# Patient Record
Sex: Female | Born: 1960
Health system: Southern US, Community
[De-identification: ages and names within clinical notes are randomized; demographics above are authoritative.]

## PROBLEM LIST (undated history)

## (undated) DIAGNOSIS — M79645 Pain in left finger(s): Secondary | ICD-10-CM

## (undated) DIAGNOSIS — E785 Hyperlipidemia, unspecified: Secondary | ICD-10-CM

## (undated) DIAGNOSIS — R2230 Localized swelling, mass and lump, unspecified upper limb: Secondary | ICD-10-CM

## (undated) DIAGNOSIS — H409 Unspecified glaucoma: Secondary | ICD-10-CM

## (undated) DIAGNOSIS — M1990 Unspecified osteoarthritis, unspecified site: Secondary | ICD-10-CM

## (undated) DIAGNOSIS — R030 Elevated blood-pressure reading, without diagnosis of hypertension: Secondary | ICD-10-CM

## (undated) DIAGNOSIS — R112 Nausea with vomiting, unspecified: Secondary | ICD-10-CM

## (undated) DIAGNOSIS — T7840XA Allergy, unspecified, initial encounter: Secondary | ICD-10-CM

## (undated) DIAGNOSIS — K449 Diaphragmatic hernia without obstruction or gangrene: Secondary | ICD-10-CM

## (undated) DIAGNOSIS — Z9889 Other specified postprocedural states: Secondary | ICD-10-CM

## (undated) DIAGNOSIS — Z973 Presence of spectacles and contact lenses: Secondary | ICD-10-CM

## (undated) DIAGNOSIS — F909 Attention-deficit hyperactivity disorder, unspecified type: Secondary | ICD-10-CM

## (undated) DIAGNOSIS — K219 Gastro-esophageal reflux disease without esophagitis: Secondary | ICD-10-CM

## (undated) DIAGNOSIS — L309 Dermatitis, unspecified: Secondary | ICD-10-CM

## (undated) DIAGNOSIS — J45909 Unspecified asthma, uncomplicated: Secondary | ICD-10-CM

## (undated) HISTORY — DX: Diaphragmatic hernia without obstruction or gangrene: K44.9

## (undated) HISTORY — DX: Hyperlipidemia, unspecified: E78.5

## (undated) HISTORY — PX: TONSILLECTOMY AND ADENOIDECTOMY: SUR1326

## (undated) HISTORY — PX: COLONOSCOPY: SHX174

## (undated) HISTORY — DX: Allergy, unspecified, initial encounter: T78.40XA

## (undated) HISTORY — DX: Unspecified asthma, uncomplicated: J45.909

## (undated) HISTORY — DX: Unspecified osteoarthritis, unspecified site: M19.90

## (undated) HISTORY — DX: Dermatitis, unspecified: L30.9

## (undated) HISTORY — DX: Gastro-esophageal reflux disease without esophagitis: K21.9

## (undated) HISTORY — PX: OTHER SURGICAL HISTORY: SHX169

## (undated) HISTORY — DX: Unspecified glaucoma: H40.9

---

## 1998-02-19 ENCOUNTER — Other Ambulatory Visit: Admission: RE | Admit: 1998-02-19 | Discharge: 1998-02-19 | Payer: Self-pay | Admitting: Obstetrics and Gynecology

## 1999-04-07 ENCOUNTER — Other Ambulatory Visit: Admission: RE | Admit: 1999-04-07 | Discharge: 1999-04-07 | Payer: Self-pay | Admitting: Obstetrics and Gynecology

## 2000-04-12 ENCOUNTER — Other Ambulatory Visit: Admission: RE | Admit: 2000-04-12 | Discharge: 2000-04-12 | Payer: Self-pay | Admitting: Obstetrics and Gynecology

## 2001-04-25 ENCOUNTER — Other Ambulatory Visit: Admission: RE | Admit: 2001-04-25 | Discharge: 2001-04-25 | Payer: Self-pay | Admitting: Obstetrics and Gynecology

## 2001-06-09 ENCOUNTER — Encounter: Payer: Self-pay | Admitting: Internal Medicine

## 2001-06-09 ENCOUNTER — Encounter: Admission: RE | Admit: 2001-06-09 | Discharge: 2001-06-09 | Payer: Self-pay | Admitting: Internal Medicine

## 2002-05-18 ENCOUNTER — Other Ambulatory Visit: Admission: RE | Admit: 2002-05-18 | Discharge: 2002-05-18 | Payer: Self-pay | Admitting: Obstetrics and Gynecology

## 2003-06-08 ENCOUNTER — Other Ambulatory Visit: Admission: RE | Admit: 2003-06-08 | Discharge: 2003-06-08 | Payer: Self-pay | Admitting: Obstetrics and Gynecology

## 2004-06-16 ENCOUNTER — Other Ambulatory Visit: Admission: RE | Admit: 2004-06-16 | Discharge: 2004-06-16 | Payer: Self-pay | Admitting: Obstetrics and Gynecology

## 2005-02-16 HISTORY — PX: SHOULDER ARTHROSCOPY: SHX128

## 2005-02-26 ENCOUNTER — Encounter: Admission: RE | Admit: 2005-02-26 | Discharge: 2005-02-26 | Payer: Self-pay | Admitting: Specialist

## 2006-01-15 ENCOUNTER — Ambulatory Visit (HOSPITAL_COMMUNITY): Admission: RE | Admit: 2006-01-15 | Discharge: 2006-01-15 | Payer: Self-pay | Admitting: Internal Medicine

## 2006-01-21 ENCOUNTER — Encounter: Admission: RE | Admit: 2006-01-21 | Discharge: 2006-01-21 | Payer: Self-pay | Admitting: Internal Medicine

## 2006-03-15 ENCOUNTER — Ambulatory Visit: Payer: Self-pay | Admitting: Internal Medicine

## 2006-06-04 ENCOUNTER — Encounter
Admission: RE | Admit: 2006-06-04 | Discharge: 2006-09-02 | Payer: Self-pay | Admitting: Physical Medicine & Rehabilitation

## 2006-06-08 ENCOUNTER — Ambulatory Visit: Payer: Self-pay | Admitting: Physical Medicine & Rehabilitation

## 2008-02-17 HISTORY — PX: KNEE ARTHROSCOPY: SUR90

## 2009-03-25 ENCOUNTER — Telehealth: Payer: Self-pay | Admitting: Internal Medicine

## 2009-03-26 ENCOUNTER — Encounter: Payer: Self-pay | Admitting: Internal Medicine

## 2009-03-26 ENCOUNTER — Ambulatory Visit: Payer: Self-pay | Admitting: Gastroenterology

## 2009-03-26 DIAGNOSIS — R12 Heartburn: Secondary | ICD-10-CM | POA: Insufficient documentation

## 2009-03-26 DIAGNOSIS — K219 Gastro-esophageal reflux disease without esophagitis: Secondary | ICD-10-CM | POA: Insufficient documentation

## 2009-03-26 DIAGNOSIS — K3189 Other diseases of stomach and duodenum: Secondary | ICD-10-CM | POA: Insufficient documentation

## 2009-03-26 DIAGNOSIS — M1990 Unspecified osteoarthritis, unspecified site: Secondary | ICD-10-CM | POA: Insufficient documentation

## 2009-03-26 DIAGNOSIS — R1013 Epigastric pain: Secondary | ICD-10-CM | POA: Insufficient documentation

## 2009-05-02 ENCOUNTER — Ambulatory Visit: Payer: Self-pay | Admitting: Internal Medicine

## 2009-05-04 ENCOUNTER — Encounter: Payer: Self-pay | Admitting: Internal Medicine

## 2010-03-18 NOTE — Assessment & Plan Note (Signed)
Summary: WORSENING GERD,DYSPHAGIA       (DR.BRODIE PT.)          Anita Snow   History of Present Illness Visit Type: Initial Visit Primary GI MD: Lina Sar MD Primary Provider: Monna Fam Chief Complaint: Worsening Anita Snow, pt states she can not take OTC PPI's states  they give her bad headaches History of Present Illness:   50 YO FEMALE KNOWN TO DR. Juanda Chance WHO WAS LAST SEEN IN 2008. SHEHAS HX OF DYSPEPSIA,PREVIOUS REMOTE RX FOR H.PYLORI. SHE HAD UNDERGONE WOKUP OF GB AS WELL WHICH WAS NEGATIVE. SHE COMES IN TODAY WITH RECURRENT DYSPEPTIC SXS. SHE HAD BEEN USING MOBIC AND MOTRIN REGULARLY FOR ORTHOPEDIC ISSUES  A FEW MONTHS BACK AND DEVELOPED SXS.SHE WAS GIVEN A ONE MONTH RX FOR NEXIUM AND SHE FELT BETTER, HAS BEEN TRYING TO AVOID NSAIDS SINCE. SHE CONTINUES TO HAVE INTERMITTENT EPIGASTRIC PRESSURE,SOME HEARTBURN,NO DYSPHAGIA.SHE WAKES UP WITH A BAD TASTE IN HER MOUTH.  SHE HAS NO CURRENT LOWER GI SXS,HAS NOT HAD A SCREENING COLONOSCOPY.   GI Review of Systems    Reports abdominal pain and  heartburn.     Location of  Abdominal pain: epigastric area.    Denies acid reflux, belching, bloating, chest pain, dysphagia with liquids, dysphagia with solids, loss of appetite, nausea, vomiting, vomiting blood, and  weight loss.        Denies anal fissure, black tarry stools, change in bowel habit, constipation, diarrhea, diverticulosis, fecal incontinence, heme positive stool, hemorrhoids, irritable bowel syndrome, jaundice, light color stool, liver problems, rectal bleeding, and  rectal pain. Preventive Screening-Counseling & Management  Alcohol-Tobacco     Smoking Status: never      Drug Use:  no.      Current Medications (verified): 1)  Mobic 7.5 Mg Tabs (Meloxicam) .... As Needed 2)  Vyvanse 60 Mg Caps (Lisdexamfetamine Dimesylate) .Marland Kitchen.. 1 By Mouth Once Daily 3)  Ortho-Novum 7/7/7 (28) 0.5/0.75/1-35 Mg-Mcg Tabs (Norethin-Eth Estrad Triphasic) .Marland Kitchen.. 1 By Mouth Once Daily  Allergies  (verified): 1)  ! Cephalexin 2)  ! * Shellfish  Past History:  Past Medical History: Arthritis GERD hiatal henia 1995  Past Surgical History: c-sectionx2 shoulder surgery  Family History: Family History of Heart Disease:  No FH of Colon Cancer:  Social History: married 2 children Patient has never smoked.  Daily Caffeine Use Alcohol Use - yes  2 per week Illicit Drug Use - no Smoking Status:  never Drug Use:  no  Review of Systems       The patient complains of allergy/sinus and arthritis/joint pain.  The patient denies anemia, anxiety-new, blood in urine, breast changes/lumps, change in vision, confusion, cough, coughing up blood, depression-new, fainting, fatigue, fever, headaches-new, hearing problems, heart murmur, heart rhythm changes, muscle pains/cramps, night sweats, nosebleeds, pregnancy symptoms, shortness of breath, skin rash, sleeping problems, sore throat, swelling of feet/legs, swollen lymph glands, thirst - excessive, urination - excessive, urination changes/pain, urine leakage, vision changes, and voice change.         ROS OTHERWISE AS IN HPI  Vital Signs:  Patient profile:   50 year old female Height:      65 inches Weight:      148 pounds BMI:     24.72 BSA:     1.74 Pulse rate:   80 / minute Pulse rhythm:   regular BP sitting:   132 / 78  (left arm)  Vitals Entered By: [name] CMA [name]) (March 26, 2009 2:13 PM)  Physical Exam  General:  Well developed, well nourished, no acute distress. Head:  Normocephalic and atraumatic. Eyes:  PERRLA, no icterus. Lungs:  Clear throughout to auscultation. Heart:  Regular rate and rhythm; no murmurs, rubs,  or bruits. Abdomen:  SOFT, MINIMALLY TENDER EPIGASTRIUM, NO MASS OR HSM,BS+ Rectal:  NOT DONE Extremities:  No clubbing, cyanosis, edema or deformities noted. Neurologic:  Alert and  oriented x4;  grossly normal neurologically. Psych:  Alert and cooperative. Normal mood and  affect.   Impression & Recommendations:  Problem # 1:  DYSPEPSIA (ICD-536.8) Assessment Deteriorated RECURRENT SXS,PROBABLY NSAID INDUCED GASTROPATHY,COMPONENT OF GERD.  RESTERT NEXIUM 40 MG DAILY IN AM SCHEDULE FOR UPPER ENDOSCOPY WITH DR. Hermelinda Medicus DISCUSSED IN DETAIL WITH PT. AVOID NSAIDS Orders: Colon/Endo (Colon/Endo)  Problem # 2:  COLON NEOPLASIA SCREENING Assessment: Comment Only CURRENTLY ASYMPTOMATIC  SCHEDULE FOR COLONOSCOPY WITH DR. Juanda Chance.PROCEDURE DISCUSSED IN DETAIL WITH PT.  Patient Instructions: 1)  Endoscopy and Colonoscopy scheduled for 05-02-09 with Dr. Juanda Chance . 2)  Instructions provided. 3)  Perscription for Miralax Colon preperation and Nexium, 30 day supply sent to [facility] Dr. 4)  90 day supply of Nexium sent to you mail order pharmacy. 5)  Copy sent to : [name], MD 6)  The medication list was reviewed and reconciled.  All changed / newly prescribed medications were explained.  A complete medication list was provided to the patient / caregiver. Prescriptions: NEXIUM 40 MG CPDR (ESOMEPRAZOLE MAGNESIUM) Take 1 capsule 30 min prior to breakfast  #90 x 3   Entered by:   Lowry Ram NCMA   Authorized by:   Sammuel Cooper PA-c   Signed by:   Lowry Ram NCMA on 03/26/2009   Method used:   Electronically to        [facility]* (mail-order)             ,          Ph: [phone]       Fax: [phone]   RxID:   [id number] NEXIUM 40 MG CPDR (ESOMEPRAZOLE MAGNESIUM) Take 1 capsule 30 min prior to breakfast  #30 x 0   Entered by:   Lowry Ram NCMA   Authorized by:   Sammuel Cooper PA-c   Signed by:   Lowry Ram NCMA on 03/26/2009   Method used:   Electronically to        [facility] # [phone]* (retail)       [address]       [place], [place]  [address]       Ph: [phone]       Fax: [phone]   RxID:   [id number] NEXIUM 40 MG CPDR (ESOMEPRAZOLE MAGNESIUM) Take 1 capsule 30 min prior to  breakfast  #30 x 0   Entered by:   Lowry Ram NCMA   Authorized by:   Sammuel Cooper PA-c   Signed by:   Lowry Ram NCMA on 03/26/2009   Method used:   Electronically to        [facility] # [id number]* (retail)       [address]       [place], [place]  [address]       Ph: [phone]       Fax: [phone]   RxID:   [id number] REGLAN 10 MG  TABS (METOCLOPRAMIDE HCL) As per prep instructions.  #2 x 0   Entered by:   Lowry Ram NCMA   Authorized by:   Sammuel Cooper  PA-c   Signed by:   Lowry Ram NCMA on 03/26/2009   Method used:   Electronically to        [facility] # [phone]* (retail)       [address]       [place], [place]  [address]       Ph: [phone]       Fax: [phone]   RxID:   [id number] METOCLOPRAMIDE HCL 10 MG  TABS (METOCLOPRAMIDE HCL) As per prep instructions.  #2 x 0   Entered by:   Lowry Ram NCMA   Authorized by:   Sammuel Cooper PA-c   Signed by:   Lowry Ram NCMA on 03/26/2009   Method used:   Electronically to        [facility] # [phone]* (retail)       [address]       [place], [place]  [address]       Ph: [phone]       Fax: [phone]   RxID:   [id number] MIRALAX   POWD (POLYETHYLENE GLYCOL 3350) As per prep  instructions.  #255gm x 0   Entered by:   Lowry Ram NCMA   Authorized by:   Sammuel Cooper PA-c   Signed by:   Lowry Ram NCMA on 03/26/2009   Method used:   Electronically to        [facility] # [id number]* (retail)       [address]       [place], [place]  [address]       Ph: [phone]       Fax: [phone]   RxID:   [id number]  One Nexium perscription sent to [facility] in error. Intention was to sent on the [facility] order and one to [facility]. Dr.

## 2010-03-18 NOTE — Procedures (Signed)
Summary: Colonoscopy  Patient: [name] Note: All result statuses are Final unless otherwise noted.  Tests: (1) Colonoscopy (COL)   COL Colonoscopy           DONE     [facility]     [address]     [place], [place]  [address]           COLONOSCOPY PROCEDURE REPORT           PATIENT:  Anita Snow, Anita Snow  MR#:  [id number]     BIRTHDATE:  [birth year], 48 yrs. old  GENDER:  female           ENDOSCOPIST:  Hedwig Morton. Juanda Chance, MD     Referred by:  [name], M.D.           PROCEDURE DATE:  05/02/2009     PROCEDURE:  Colonoscopy 19[address]     ASA CLASS:  Class I     INDICATIONS:  Routine Risk Screening           MEDICATIONS:   Versed 1 mg, Fentanyl 50 mcg           DESCRIPTION OF PROCEDURE:   After the risks benefits and     alternatives of the procedure were thoroughly explained, informed     consent was obtained.  Digital rectal exam was performed and     revealed no rectal masses.   The LB CF-H180AL [id number] endoscope     was introduced through the anus and advanced to the cecum, which     was identified by both the appendix and ileocecal valve, without     limitations.  The quality of the prep was good, using MiraLax.     The instrument was then slowly withdrawn as the colon was fully     examined.     <<PROCEDUREIMAGES>>           FINDINGS:  No polyps or cancers were seen (see image1, image2,     image3, and image4).   Retroflexed views in the rectum revealed no     abnormalities.    The scope was then withdrawn from the patient     and the procedure completed.           COMPLICATIONS:  None           ENDOSCOPIC IMPRESSION:     1) No polyps or cancers     2) Normal colonoscopy     RECOMMENDATIONS:     1) high fiber diet           REPEAT EXAM:  In 10 year(s) for.           ______________________________     Hedwig Morton. Juanda Chance, MD           CC:           n.     eSIGNED:   Hedwig Morton. Seven [place] at 05/02/2009 08:50 AM           Veronia Beets, [id number]  Note: An  exclamation mark (!) indicates a result that was not dispersed into the flowsheet. Document Creation Date: 05/02/2009 8:51 AM _______________________________________________________________________  (1) Order result status: Final Collection or observation date-time: 05/02/2009 08:36 Requested date-time:  Receipt date-time:  Reported date-time:  Referring Physician:   Ordering Physician: Lina Sar [id number]) Specimen Source:  Source: Launa Grill Order Number: [id number] Lab site:   Appended Document: Colonoscopy    Clinical Lists Changes  Observations: Added new observation of  COLONNXTDUE: 04/2019 (05/02/2009 13:32)      Appended Document: Colonoscopy     Procedures Next Due Date:    Colonoscopy: 05/2019

## 2010-03-18 NOTE — Procedures (Signed)
Summary: Upper Endoscopy  Patient: Amil Moseman Note: All result statuses are Final unless otherwise noted.  Tests: (1) Upper Endoscopy (EGD)   EGD Upper Endoscopy       DONE     [facility]     [address]     [place], [place]  [address]           ENDOSCOPY PROCEDURE REPORT           PATIENT:  Skarleth, Delmonico  MR#:  [id number]     BIRTHDATE:  12-02-1960, 48 yrs. old  GENDER:  female           ENDOSCOPIST:  Hedwig Morton. Juanda Chance, MD     Referred by:  [name], M.D.           PROCEDURE DATE:  05/02/2009     PROCEDURE:  EGD with biopsy     ASA CLASS:  Class I     INDICATIONS:  GERD, heartburn improved on Nexiem, D/C'ed NSAIDs     treated for H.pylori in the past           MEDICATIONS:   Versed 9 mg, Fentanyl 50 mcg     TOPICAL ANESTHETIC:  Exactacain Spray           DESCRIPTION OF PROCEDURE:   After the risks benefits and     alternatives of the procedure were thoroughly explained, informed     consent was obtained.  The LB GIF-H180 [id number] endoscope was     introduced through the mouth and advanced to the second portion of     the duodenum, without limitations.  The instrument was slowly     withdrawn as the mucosa was fully examined.     <<PROCEDUREIMAGES>>           A hiatal hernia was found. 1-2 cm sliding hiatal hernia With     standard forceps, a biopsy was obtained and sent to pathology (see     image1 and image4).  Otherwise the examination was normal.     biopsies from a normal appearing antrum With standard forceps, a     biopsy was obtained and sent to pathology. hx of H (see image3 and     image2).Pylori    Retroflexed views revealed no abnormalities.     The scope was then withdrawn from the patient and the procedure     completed.           COMPLICATIONS:  None           ENDOSCOPIC IMPRESSION:     1) Hiatal hernia     2) Otherwise normal examination     s/p biopsies from gastric antrum and from g-e junction     RECOMMENDATIONS:     1) Await  biopsy results     2) Anti-reflux regimen to be follow     cont. Nexiem 40 mg daily           REPEAT EXAM:  In 0 year(s) for.           ______________________________     Hedwig Morton. Juanda Chance, MD           CC:           n.     eSIGNED:   Hedwig Morton. Brodie at 05/02/2009 08:46 AM           Veronia Beets, [id number]  Note: An exclamation mark (!) indicates a result that was not dispersed into the  flowsheet. Document Creation Date: 05/02/2009 8:47 AM _______________________________________________________________________  (1) Order result status: Final Collection or observation date-time: 05/02/2009 08:23 Requested date-time:  Receipt date-time:  Reported date-time:  Referring Physician:   Ordering Physician: Lina Sar [phone]) Specimen Source:  Source: Launa Grill Order Number: [id number] Lab site:

## 2010-03-18 NOTE — Letter (Signed)
Summary: Patient [name] Biopsy Results  [name]  [address] [place], [place] [address]   Phone: [phone]  Fax: [phone]        May 04, 2009 MRN: [id number]    [facility] [address] [place], [place]  [address]    Dear Anita Snow,  I am pleased to inform you that the biopsies taken during your recent endoscopic examination did not show any evidence of cancer upon pathologic examination.The biopsies show mild inflammation due to reflux  Additional information/recommendations:  __No further action is needed at this time.  Please follow-up with      your primary care physician for your other healthcare needs.  __ Please call [phone] to schedule a return visit to review      your condition.  _x_ Continue with the treatment plan as outlined on the day of your      exam.  __ You should have a repeat endoscopic examination for this problem              in _ months/years.   Please call us if you are having persistent problems or have questions about your condition that have not been fully answered at this time.  Sincerely,  Hart Carwin MD  This letter has been electronically signed by your physician.  Appended Document: Patient Notice-Endo Biopsy Results Letter mailed 3.22.11

## 2010-03-18 NOTE — Progress Notes (Signed)
Summary: TRIAGE  Phone Note Call from Patient Call back at [phone]   Caller: Patient Call For: Dr. Juanda Chance Reason for Call: Talk to Nurse Summary of Call: Pt is having problems with her Acid Reflux and said she cannot wait until April to see Dr. Juanda Chance. Wants a sooner appt. Initial call taken by: Karna Christmas,  March 25, 2009 3:25 PM  Follow-up for Phone Call        Worsening GERD and dysphagia. Doesn't take a PPI. Wants an ASAP appt.  Pt. will see [facility] 03-26-09 at 2pm.  Follow-up by: Laureen Ochs LPN,  March 25, 2009 3:32 PM

## 2010-03-18 NOTE — Letter (Signed)
Summary: [facility] Instructions  [facility]  [address] [place], [place] [address]   Phone: [phone]  Fax: [phone]       Anita Snow    03-Jan-1961    MRN: [id number]       Procedure Day /Date:05-02-09     Arrival Time: 7:30 AM     Procedure Time: 8:00 AM     Location of Procedure:                    X     [facility] (4th Floor)       PREPARATION FOR COLONOSCOPY WITH MIRALAX  Starting 5 days prior to your procedure 04-27-09 do not eat nuts, seeds, popcorn, corn, beans, peas,  salads, or any raw vegetables.  Do not take any fiber supplements (e.g. Metamucil, Citrucel, and Benefiber). ____________________________________________________________________________________________________   THE DAY BEFORE YOUR PROCEDURE         DATE:05-01-09 DAY: Wednesday  1   Drink clear liquids the entire day-NO SOLID FOOD  2   Do not drink anything colored red or purple.  Avoid juices with pulp.  No orange juice.  3   Drink at least 64 oz. (8 glasses) of fluid/clear liquids during the day to prevent dehydration and help the prep work efficiently.  CLEAR LIQUIDS INCLUDE: Water Jello Ice Popsicles Tea (sugar ok, no milk/cream) Powdered fruit flavored drinks Coffee (sugar ok, no milk/cream) Gatorade Juice: apple, white grape, white cranberry  Lemonade Clear bullion, consomm, broth Carbonated beverages (any kind) Strained chicken noodle soup Hard Candy  4   Mix the entire bottle of Miralax with 64 oz. of Gatorade/Powerade in the morning and put in the refrigerator to chill.  5   At 3:00 pm take 2 Dulcolax/Bisacodyl tablets.  6   At 4:30 pm take one Reglan/Metoclopramide tablet.  7  Starting at 5:00 pm drink one 8 oz glass of the Miralax mixture every 15-20 minutes until you have finished drinking the entire 64 oz.  You should finish drinking prep around 7:30 or 8:00 pm.  8   If you are nauseated, you may take the 2nd Reglan/Metoclopramide tablet at  6:30 pm.        9    At 8:00 pm take 2 more DULCOLAX/Bisacodyl tablets.     THE DAY OF YOUR PROCEDURE      DATE:  05-02-09 DAY: Thursday  You may drink clear liquids until 6:00 AM (2 HOURS BEFORE PROCEDURE).   MEDICATION INSTRUCTIONS  Unless otherwise instructed, you should take regular prescription medications with a small sip of water as early as possible the morning of your procedure.       OTHER INSTRUCTIONS  You will need a responsible adult at least 52 years of age to accompany you and drive you home.   This person must remain in the waiting room during your procedure.  Wear loose fitting clothing that is easily removed.  Leave jewelry and other valuables at home.  However, you may wish to bring a book to read or an iPod/MP3 player to listen to music as you wait for your procedure to start.  Remove all body piercing jewelry and leave at home.  Total time from sign-in until discharge is approximately 2-3 hours.  You should go home directly after your procedure and rest.  You can resume normal activities the day after your procedure.  The day of your procedure you should not:   Drive   Make legal  decisions   Operate machinery   Drink alcohol   Return to work  You will receive specific instructions about eating, activities and medications before you leave.   The above instructions have been reviewed and explained to me by   _______________________    I fully understand and can verbalize these instructions _____________________________ Date _______

## 2010-07-04 NOTE — Group Therapy Note (Signed)
In consult, questioned by Dr. Sheran Luz in regards to neck pain,  assess for acupuncture therapy.   This is a 50 year old female with a 2-year history of neck pain,  insidious onset associated with left shoulder. Scapular, and forearm  pain.  She has seen orthopedics in regards to her shoulder and it was  not felt to have any significant rotator cuff pathology.  She has  responded partially to physical therapy doing scapular strengthening  exercises as well as some neck range of motion exercises.  She has tried  Flexeril but could not tolerate this from a drowsiness standpoint in the  morning, has difficulty tolerating NSAIDS.  Had an MRI of her cervical  spine showing a shallow base disc protrusion at C5-6.  No significant  spinal stenosis.  She has occasional paresthesias in her left arm but no  consistent numbness, no hand pain or numbness.   PAST MEDICAL HISTORY:  She had some reflux during pregnancy, no history  of ulcers, has some swelling in the lower extremities, sometimes feels  like it is associated with certain foods she eats.   MEDICATIONS:  Adderall XR, Ortho-Novum, and p.r.n. antihistamines --  Flonase -- on a seasonal basis.  She is on Nexium for hiatal hernia.   ALLERGIES:  CEPHALOSPORINS, SHRIMP, and CRAB -- causing a swelling lips,  fingers, hands.  Hives to the index.  Contact dermatitis to dry cleaning  or detergens.   PAST SURGICAL HISTORY:  Tonsillectomy in 1968, C-section in 1991 and  1994.   SOCIAL HISTORY:  Married, lives with husband, 2 boys, part-time work.   FAMILY HISTORY:  Heart disease, alcohol abuse.   EXAMINATION:  VITAL SIGNS:  Blood pressure 135/78, pulse 65, respiratory  rate 16, 02 saturation 100% on room air.  GENERAL:  A well-developed well-nourished female, appearing younger than  stated age.  NECK:  Has 75% range of motion, lateral bending, and flexion and  extension.  No tenderness along the axial midline of the cervical spine.  No  tenderness over the upper trapezius or scapulary region.  She has  negative impingement signs, negative Spurling's maneuver, normal  strength in deltoids, biceps, grip, wrist extension and arm, external  rotators as well as hip flexion and knee extension.  Full dorsiflexion.  Gait is normal.  Sensation normal in the extremities.  Deep tendon  reflexes are normal in the upper and lower extremities.  No tenderness  over the pectoralis or clavicular area on the left side.  Tenderness  over the left extensor forearm at midpoint.   IMPRESSION:  Cervical pain appears to be a combination of cervical facet  given pain that is exacerbated by extension which occurs usually during  sleeping as well as cervical and parascapular myofascial pain.  She does  not have a clear radicular pattern to her left upper extremity,  paresthesias and pain pattern and this also could be referred from more  possible trigger points.  She also desires acupuncture to sinus as well  as GI symptoms.  This can be incorporated to her overall treatment and  plan.   ADDENDUM:  Acupuncture treatment today consisted of bilateral KI3, SP6,  ST36 as well as left TH8, left TH5.  E stem between SP6, KI3, as well as  TH5 and TH8, 2 Hz x20 minutes.  The patient tolerated the procedure  well.  Will schedule for twice weekly for 2 more weeks treatment.  Will  incorporate sinus treatments next visit.  Erick Colace, M.D.  Electronically Signed     AEK/MedQ  D:  06/08/2006 09:36:43  T:  06/08/2006 10:41:20  Job #:  [id number]   cc:   Hayden Rasmussen, M.D.

## 2010-07-04 NOTE — Procedures (Signed)
Anita Snow, Anita Snow                ACCOUNT NO.:  [id number]   MEDICAL RECORD NO.:  [id number]          PATIENT TYPE:  REC   LOCATION:  [facility]                          FACILITY:  [facility]   PHYSICIAN:  Erick Colace, M.D.DATE OF BIRTH:  Oct 01, 1960   DATE OF PROCEDURE:  DATE OF DISCHARGE:                               OPERATIVE REPORT   PROCEDURE PERFORMED:  Acupuncture treatment #3.   Treatment today consisted of bilateral BL10, as well as paraspinal at C4  3 cm lateral to spinous process bilaterally, left GB21 and DU14.  Electrostimulation between all needles except for D14 4 Hz x30 minutes.  The patient tolerated the procedure well. Postprocedure instructions  given.      Erick Colace, M.D.  Electronically Signed     AEK/MEDQ  D:  06/21/2006 16:47:48  T:  06/22/2006 06:48:03  Job:  [id number]

## 2010-07-04 NOTE — Procedures (Signed)
NAMELLESENIA, FOGAL                ACCOUNT NO.:  [id number]   MEDICAL RECORD NO.:  [id number]          PATIENT TYPE:  REC   LOCATION:  [facility]                          FACILITY:  [facility]   PHYSICIAN:  Erick Colace, M.D.DATE OF BIRTH:  04/04/1960   DATE OF PROCEDURE:  06/28/2006  DATE OF DISCHARGE:                               OPERATIVE REPORT   This 50 year old female is scheduled for acupuncture treatment.  This  will be her fourth treatment and she is deriving benefit thus far.  Her  pain is down to a 1/10 level, it is mainly in the parascapular region on  left and neck area more left than right.   Treatment today consisted of SI-11, BL-10, BL-44, and SI-13 electrical  stimulation 4 Hz x30 minutes.  Patient tolerated the procedure well.  Will spread out treatments to once a week and see her again next week  and from there may be able to go 2 weeks between treatments and  eventually 1 month.  Patient is continuing to do yoga and I encouraged  her to continue this.      Erick Colace, M.D.  Electronically Signed     AEK/MEDQ  D:  06/28/2006 16:15:58  T:  06/28/2006 22:16:46  Job:  [id number]   cc:   Caralyn Guile. Ethelene Hal, M.D.  Fax: [phone]

## 2010-07-04 NOTE — Assessment & Plan Note (Signed)
[facility]                         GASTROENTEROLOGY OFFICE NOTE   AJIA, CHADDERDON                       MRN:          [id number]  DATE:03/15/2006                            DOB:          November 17, 1960    Ms. Hanzlik is a very nice 50 year old white female who is here at Dr.  Netta Corrigan referral for evaluation of dyspepsia.  I have seen her  indirectly in 1991, when she had an upper abdominal ultrasound in May  2001 for right upper quadrant abdominal pain.  Her ultrasound at that  time was completely normal.  She dates onset of her digestive problems  to her first and second pregnancy  in 30 and 31.  She had symptoms  of reflux and was evaluated in [place], [place] .  She had upper  endoscopy that showed small hiatal, gastritis and H. Pylori antibody.  Patient was treated for H. Pylori in 1990s and again was retreated  later, after she had recurrence of her dyspepsia.  Since then, she had  off and on food intolerance.  Her symptoms have flared up after course  of Advil, the last four taking one Advil twice a day for about a month  for neck pain.  Dr. Merla Riches started her on Nexium 40 mg a day, which  has finally had good results in reducing the dyspepsia.  She is about  80% improved now.  There is no family history of digestive problems.  She feels that she has always had a sensitive stomach even as a child.  Repeat upper abdominal ultrasound and HIDA scan in the fall of 2007 were  negative.  Her discomfort was localized to epigastrium along the right  costal margin, somewhat to the back, but currently her discomfort is  only in the epigastrium.  There has been no dysphagia.  Dr. Clearance Coots tried  her in the past on antispasmodic.  She reported good results with this  medication, but has not taken it recently.   MEDICATIONS:  1. Ortho-Novum.  2. Adderall XR 25 mg q.d.  3. Nexium 40 mg p.o. q.d.   PAST MEDICAL HISTORY:  Tonsillectomy and  adenoidectomy, 2005 two c-  sections 1991, 1994.   FAMILY HISTORY:  Heart disease in her father, alcoholism in her father.   SOCIAL HISTORY:  Married, has two children.  He works part-time in TEFL teacher.  She is a Engineer, maintenance (IT).  She does not smoke,  drinks alcohol only socially, but is seems to cause digestive problems.   REVIEW OF SYSTEMS:  POSITIVE FOR ALLERGIES, ARTHRITIC COMPLAINTS,  OCCASIONAL SORE THROAT.   PHYSICAL EXAMINATION:  VITAL SIGNS:  Blood pressure 118/76, pulse 68 and  weight 142 pounds.  GENERAL:  She was alert, oriented, in no distress.  LUNGS:  Clear to auscultation.  COR:  Normal S1, normal S2.  HEENT:  Sclerae is nonicteric.  ABDOMEN:  Soft with discomfort above the umbilicus in the midline, and  somewhat to the left and right of the midline as well, also in  subxyphoid area.  There were no pulsations.  The liver edge  was at  costal margin.  The liver itself did not appear to be tender.  There  were no scars.  ABDOMEN:  Unremarkable.  RECTAL:  Showed normal rectal tones with hemoccult-negative stool.  EXTREMITIES:  Trace edema.   IMPRESSION:  A 50 year old white female with chronic dyspepsia.  History  of H. Pylori treated twice.  Her symptoms are suggestive for functional  dyspepsia.  After having negative gallbladder status, I think it is very  unlikely that her symptoms are related to poor gallbladder function,  although it can never be ruled out 100%.  Since she has been improved on  proton pump inhibitor, I suspect that her dyspepsia is acid related.   I have offered her repeat upper endoscopy, but she would rather wait and  continue to take her Nexium.  It is okay with me.  I do not necessary  feel that she has to have another endoscopy, but if her symptoms do  recur, or if they are not responding to Nexium, I think she should go to  have another exam  and re-biopsying her for H. Pylori.  I also have  talked to her about using  antispasmodics, since Nu-Lev  in the past  helped.  I emphasize the fact that she is intolerant to NSAIDS and  therefore should avoid them.  Tylenol may be the only reasonable  substitute for NSAIDS.   PLAN:  1. Levsin sublingually 0.125 mg p.r.n.  2. Refills for Nexium, mail order.  3. Low-fat diet.   She will return to [place] on a p.r.n. basis.     Hedwig Morton. Juanda Chance, MD  Electronically Signed    DMB/MedQ  DD: 03/15/2006  DT: 03/15/2006  Job #: [id number]   cc:   Harrel Lemon. Merla Riches, M.D.

## 2010-07-04 NOTE — Procedures (Signed)
NAMEMAKAHLA, KISER                ACCOUNT NO.:  [id number]   MEDICAL RECORD NO.:  [id number]          PATIENT TYPE:  REC   LOCATION:  [facility]                          FACILITY:  [facility]   PHYSICIAN:  Erick Colace, M.D.DATE OF BIRTH:  09-15-1960   DATE OF PROCEDURE:  06/25/2006  DATE OF DISCHARGE:                               OPERATIVE REPORT   Ms. Lunn follows up today for treatment of her neck.  Her last  treatment was done on Jun 22, 2006.  She had good relief of pain for at  least one day and now it is gradually returning.  Her left arm pain has  improved and is no longer hurting at this point.   The treatment today consists of bilateral BL10, bilateral GB21, B4, and  SI50.  Electrical stimulation 4 hertz x30 minutes.  The patient  tolerated the procedure well.  She will return in three days for repeat  treatment.      Erick Colace, M.D.  Electronically Signed     AEK/MEDQ  D:  06/25/2006 15:20:13  T:  06/25/2006 20:09:57  Job:  [id number]

## 2010-07-04 NOTE — Procedures (Signed)
[name], [name]                ACCOUNT NO.:  [id number]   MEDICAL RECORD NO.:  [id number]          PATIENT TYPE:  REC   LOCATION:  [facility]                          FACILITY:  [facility]   PHYSICIAN:  Erick Colace, M.D.DATE OF BIRTH:  1960-09-19   DATE OF PROCEDURE:  06/14/2006  DATE OF DISCHARGE:                               OPERATIVE REPORT   PROCEDURE PERFORMED:  Acupuncture treatment #2.   INDICATIONS:  Myofascial pain cervical trapezius region, parascapular  region as well as left upper extremity.   Patient placed in prone position.  Needles placed at left BL41, left  BL42, left BL43 and left BL44 as well as left SI11, left LI11, left TH8.  Electrical stimulation between these points.  Then needles placed at  KI3, at P6 and GB34 with electrical stimulation between KI3 and SB6  bilaterally.  The patient tolerated the procedure well.  Treatment time  was 30 minutes 4 Hz.  Return later this week for repeat treatment.      Erick Colace, M.D.  Electronically Signed     AEK/MEDQ  D:  06/14/2006 16:49:41  T:  06/15/2006 06:19:55  Job:  [id number]   cc:   Caralyn Guile. Ethelene Hal, M.D.  Fax: [phone]

## 2010-11-03 ENCOUNTER — Telehealth: Payer: Self-pay | Admitting: Internal Medicine

## 2010-11-03 NOTE — Telephone Encounter (Signed)
Reviewed and agree.

## 2010-11-03 NOTE — Telephone Encounter (Signed)
Left a message for patient to call me. 

## 2010-11-03 NOTE — Telephone Encounter (Signed)
Patient returned my call and states she has a history of gall bladder problems. No stones but spasms. States she had a bad weekend with nausea, bloating and right side abdominal pain. These symptoms are similar to her previous episodes.  She would like to be seen. 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 Last ECOL 05/02/09. Scheduled patient with Willette Cluster, NP; on 11/04/10 at 1:30 PM. Called and left patient a message with appointment

## 2010-11-04 ENCOUNTER — Other Ambulatory Visit (INDEPENDENT_AMBULATORY_CARE_PROVIDER_SITE_OTHER): Payer: Managed Care, Other (non HMO)

## 2010-11-04 ENCOUNTER — Ambulatory Visit (INDEPENDENT_AMBULATORY_CARE_PROVIDER_SITE_OTHER): Payer: Managed Care, Other (non HMO) | Admitting: Nurse Practitioner

## 2010-11-04 ENCOUNTER — Encounter: Payer: Self-pay | Admitting: Nurse Practitioner

## 2010-11-04 VITALS — BP 162/88 | HR 76 | Ht 65.0 in | Wt 150.4 lb

## 2010-11-04 DIAGNOSIS — R1011 Right upper quadrant pain: Secondary | ICD-10-CM

## 2010-11-04 LAB — CBC WITH DIFFERENTIAL/PLATELET
Basophils Absolute: 0 K/uL (ref 0.0–0.1)
Basophils Relative: 0.6 % (ref 0.0–3.0)
Eosinophils Absolute: 0.1 K/uL (ref 0.0–0.7)
Eosinophils Relative: 0.9 % (ref 0.0–5.0)
HCT: 37.2 % (ref 36.0–46.0)
Hemoglobin: 12.5 g/dL (ref 12.0–15.0)
Lymphocytes Relative: 34.3 % (ref 12.0–46.0)
Lymphs Abs: 2.1 K/uL (ref 0.7–4.0)
MCHC: 33.7 g/dL (ref 30.0–36.0)
MCV: 90.6 fl (ref 78.0–100.0)
Monocytes Absolute: 0.6 K/uL (ref 0.1–1.0)
Monocytes Relative: 9.3 % (ref 3.0–12.0)
Neutro Abs: 3.4 K/uL (ref 1.4–7.7)
Neutrophils Relative %: 54.9 % (ref 43.0–77.0)
Platelets: 318 K/uL (ref 150.0–400.0)
RBC: 4.1 Mil/uL (ref 3.87–5.11)
RDW: 12.5 % (ref 11.5–14.6)
WBC: 6.2 K/uL (ref 4.5–10.5)

## 2010-11-04 LAB — COMPREHENSIVE METABOLIC PANEL
ALT: 24 U/L (ref 0–35)
AST: 21 U/L (ref 0–37)
Albumin: 3.8 g/dL (ref 3.5–5.2)
Alkaline Phosphatase: 34 U/L — ABNORMAL LOW (ref 39–117)
BUN: 8 mg/dL (ref 6–23)
CO2: 30 mEq/L (ref 19–32)
Calcium: 9.1 mg/dL (ref 8.4–10.5)
Chloride: 103 mEq/L (ref 96–112)
Creatinine, Ser: 0.8 mg/dL (ref 0.4–1.2)
GFR: 86.19 mL/min (ref >60.00)
Glucose, Bld: 88 mg/dL (ref 70–99)
Potassium: 3.9 mEq/L (ref 3.5–5.1)
Sodium: 139 mEq/L (ref 135–145)
Total Bilirubin: 0.5 mg/dL (ref 0.3–1.2)
Total Protein: 6.9 g/dL (ref 6.0–8.3)

## 2010-11-04 MED ORDER — ESOMEPRAZOLE MAGNESIUM 40 MG PO CPDR: DELAYED_RELEASE_CAPSULE | ORAL | Status: DC

## 2010-11-04 NOTE — Patient Instructions (Signed)
Please go to the basement level to have your labs drawn.  We scheduled the Ultrasound at [facility] for Fri 11-07-2010. Directions  provided. We have given you samples of Align probiotic, take 1 daily.  We will call you with the results of the labs and the Ultrasound.

## 2010-11-05 ENCOUNTER — Telehealth: Payer: Self-pay | Admitting: *Deleted

## 2010-11-05 ENCOUNTER — Encounter: Payer: Self-pay | Admitting: Nurse Practitioner

## 2010-11-05 DIAGNOSIS — R1011 Right upper quadrant pain: Secondary | ICD-10-CM | POA: Insufficient documentation

## 2010-11-05 NOTE — Telephone Encounter (Signed)
Message copied by Daphine Deutscher on Wed Nov 05, 2010  9:20 AM ------      Message from: Meredith Pel      Created: Tue Nov 04, 2010  4:56 PM       Rene Kocher, please let patient know that her labs were normal . Thanks

## 2010-11-05 NOTE — Assessment & Plan Note (Addendum)
RUQ pain, nausea and diarrhea, gastroenteritis is possibility. She is feeling better, mainly just bloated and "sore" in upper abdomen. Patient describes similar episode several years ago at which time she was worked up for gallbladder disease. HIDA was normal. Ultrasound negative except for multiple hepatic cysts. Will obtain basic labs including hepatic function panel today. Additionally, will obtain ultrasound of abdomen. Will try Align for bloating. For now will increase Nexium to 40mg  daily since this episode occurred after dose reduction. Will call patient with test results and further recommendations

## 2010-11-05 NOTE — Progress Notes (Signed)
LAVONA NORSWORTHY [id number] 11/30/1960   HISTORY OR PRESENT ILLNESS : Patient is a 50 year old female known to Dr.Brodie for history of GERD and dyspeptic type symptoms. She was seen February 20 11th or epigastric pressure and heartburn. She subsequently had an upper endoscopy for evaluation , a screening colonoscopy was done at the same time. EGD was normal except for a small sliding hiatal hernia. Gastric biopsies part of her mild chronic gastritis, no H. pylori. Patient's symptoms improved with daily PPI.   Last Friday, four days ago, patient developed RUQ pain,nausea and diarrhea. She had an episode of this about five years ago as well as one in 1990's. Workup for gallbladder disease was negative. Patient states this pain was not what she was seen for and had upper endoscopy in twenty 2011, that was more GERD type symptoms. Her symptoms are resolving, she's just a little sore in the upper abdomen. She rarely uses NSAIDS now. Her Nexium was somehow reduced from 40mg  daily to 20mg  daily at last refill a couple of months ago.   Current Medications, Allergies, Past Medical History, Past Surgical History, Family History and Social History were reviewed in [employer] record.  PHYSICAL EXAMINATION : General: Well developed  female in no acute distress Head: Normocephalic and atraumatic Eyes:  sclerae anicteric,conjunctive pink. Ears: Normal auditory acuity Mouth: No deformity or lesions Neck: Supple, no masses.  Lungs: Clear throughout to auscultation Heart: Regular rate and rhythm; no murmurs heard Abdomen: Soft, nondistended, minimal RUQ tenderness, No masses or hepatomegaly noted. Normal bowel sounds Rectal: not done Musculoskeletal: Symmetrical with no gross deformities  Skin: No lesions on visible extremities Extremities: No edema or deformities noted Neurological: Alert oriented x 4, grossly nonfocal Cervical Nodes:  No significant cervical adenopathy Psychological:   Alert and cooperative. Normal mood and affect  ASSESSMENT AND PLAN :

## 2010-11-05 NOTE — Telephone Encounter (Signed)
Left a message for patient on her cell that her labs are normal and to call for further questions.

## 2010-11-07 ENCOUNTER — Ambulatory Visit (HOSPITAL_COMMUNITY)
Admission: RE | Admit: 2010-11-07 | Discharge: 2010-11-07 | Disposition: A | Discharge status: Home / Self Care | Payer: Managed Care, Other (non HMO) | Source: Ambulatory Visit | Attending: Nurse Practitioner | Admitting: Nurse Practitioner

## 2010-11-07 DIAGNOSIS — K7689 Other specified diseases of liver: Secondary | ICD-10-CM | POA: Insufficient documentation

## 2010-11-07 DIAGNOSIS — R1011 Right upper quadrant pain: Secondary | ICD-10-CM | POA: Insufficient documentation

## 2010-11-07 NOTE — Progress Notes (Signed)
I agree with the plan outlined in this note 

## 2010-11-11 ENCOUNTER — Telehealth: Payer: Self-pay | Admitting: Gastroenterology

## 2010-11-11 MED ORDER — ESOMEPRAZOLE MAGNESIUM 40 MG PO CPDR: 40.0000 mg | DELAYED_RELEASE_CAPSULE | Freq: Every day | ORAL | Status: DC

## 2010-11-11 NOTE — Telephone Encounter (Signed)
Pt aware of U/S results. States she needs her Nexium script sent to [facility] order pharmacy, let pt know we would send this in for her.

## 2010-11-12 ENCOUNTER — Telehealth: Payer: Self-pay | Admitting: *Deleted

## 2010-11-12 NOTE — Telephone Encounter (Signed)
Left a message for patient to call me. 

## 2010-11-12 NOTE — Telephone Encounter (Signed)
Spoke with patient and gave her the results as per Dr. Brodie. 

## 2010-11-12 NOTE — Telephone Encounter (Signed)
Message copied by Daphine Deutscher on Wed Nov 12, 2010 11:57 AM ------      Message from: Hart Carwin      Created: Wed Nov 12, 2010 11:13 AM       Please call pt with normal ultrasound, small liver cysts not significant.

## 2011-02-02 ENCOUNTER — Ambulatory Visit: Payer: Managed Care, Other (non HMO)

## 2011-02-02 DIAGNOSIS — J019 Acute sinusitis, unspecified: Secondary | ICD-10-CM

## 2011-06-12 ENCOUNTER — Other Ambulatory Visit: Payer: Self-pay | Admitting: Internal Medicine

## 2011-07-10 ENCOUNTER — Telehealth: Payer: Self-pay

## 2011-07-10 NOTE — Telephone Encounter (Signed)
Below is a message from the patient that she sent in via our website:  The following email was submitted to your website from Anita Snow I am a patient of Dr. Merla Riches and need  pre-authorization for [facility]. The form will be faxed to [facility] today.The medication is for Vyvanse 60mg , one daily.  Please fax or call [facility] with the pre-authorization as soon as possible.  I brought the form into your office last month but [employer] does not have record of it. I will come in next week to obtain a new RX and would like to get the red tape cleared before then. I have taken this medication for several years, same strength, same Dr.  [name] you and please call me if I can help on my end.  Anita Snow [phone] Date of birth: [birth year]

## 2011-07-13 NOTE — Telephone Encounter (Signed)
rx refill request for 30 day supply vivance 60mg  when ready please call for pick-up [phone]

## 2011-07-16 NOTE — Telephone Encounter (Signed)
pts chart at [facility] desk

## 2011-07-16 NOTE — Telephone Encounter (Signed)
Verified w/pt what is needed. She stated that [facility] said that they do not need a PA done right now, but she does need a RF of her Vyvanse 60 to cover her until her appt w/Dr Merla Riches on June 19.

## 2011-07-17 MED ORDER — LISDEXAMFETAMINE DIMESYLATE 60 MG PO CAPS: 60.0000 mg | ORAL_CAPSULE | ORAL | Status: DC

## 2011-07-17 NOTE — Telephone Encounter (Signed)
Notified pt that RX is ready. She stated her husband may p/up instead of her.

## 2011-07-17 NOTE — Telephone Encounter (Signed)
Appears PA was sent in 07/13/11 by Audrie Gallus, PA-C.  I have filled RX and it is at the TL desk

## 2011-08-05 ENCOUNTER — Ambulatory Visit (INDEPENDENT_AMBULATORY_CARE_PROVIDER_SITE_OTHER): Payer: Managed Care, Other (non HMO) | Admitting: Internal Medicine

## 2011-08-05 ENCOUNTER — Encounter: Payer: Self-pay | Admitting: Internal Medicine

## 2011-08-05 VITALS — BP 135/89 | HR 77 | Temp 98.4°F | Resp 16 | Ht 65.0 in | Wt 149.2 lb

## 2011-08-05 DIAGNOSIS — M8580 Other specified disorders of bone density and structure, unspecified site: Secondary | ICD-10-CM | POA: Insufficient documentation

## 2011-08-05 DIAGNOSIS — F988 Other specified behavioral and emotional disorders with onset usually occurring in childhood and adolescence: Secondary | ICD-10-CM

## 2011-08-05 DIAGNOSIS — K219 Gastro-esophageal reflux disease without esophagitis: Secondary | ICD-10-CM

## 2011-08-05 DIAGNOSIS — M5030 Other cervical disc degeneration, unspecified cervical region: Secondary | ICD-10-CM | POA: Insufficient documentation

## 2011-08-05 DIAGNOSIS — M19019 Primary osteoarthritis, unspecified shoulder: Secondary | ICD-10-CM | POA: Insufficient documentation

## 2011-08-05 DIAGNOSIS — J309 Allergic rhinitis, unspecified: Secondary | ICD-10-CM | POA: Insufficient documentation

## 2011-08-05 MED ORDER — LISDEXAMFETAMINE DIMESYLATE 60 MG PO CAPS: 60.0000 mg | ORAL_CAPSULE | ORAL | Status: DC

## 2011-08-05 MED ORDER — ESOMEPRAZOLE MAGNESIUM 40 MG PO CPDR: 40.0000 mg | DELAYED_RELEASE_CAPSULE | Freq: Every day | ORAL | Status: DC
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
## 2011-08-12 ENCOUNTER — Other Ambulatory Visit: Payer: Self-pay

## 2011-11-23 ENCOUNTER — Telehealth: Payer: Self-pay

## 2011-11-23 NOTE — Telephone Encounter (Signed)
The patient called to get new Rx for Vyvanse.  The patient stated that the rx given to her on 08/05/11 to fill after November 05, 2011 was only written for 30 day not 90 days as it should have been.  Please call the patient at [phone] when ready for pick up.  The patient stated she authorizes her husband to pick up her medication.

## 2011-11-23 NOTE — Telephone Encounter (Signed)
Dr. Merla Riches,  Please address this one.

## 2011-11-24 MED ORDER — LISDEXAMFETAMINE DIMESYLATE 60 MG PO CAPS: 60.0000 mg | ORAL_CAPSULE | ORAL | Status: DC

## 2011-11-24 NOTE — Telephone Encounter (Signed)
She is correct that she gets 90 day supplies from [facility] I reordered this today

## 2011-11-24 NOTE — Telephone Encounter (Signed)
Called patient to advise. Have left Rx at desk for p/u

## 2012-03-20 ENCOUNTER — Telehealth: Payer: Self-pay

## 2012-03-20 NOTE — Telephone Encounter (Signed)
PATIENT WOULD LIKE 30-DAY REFILL OF VYVANCE UNITL HER APPOINTMENT WITH HIM IN [facility].

## 2012-03-23 MED ORDER — LISDEXAMFETAMINE DIMESYLATE 60 MG PO CAPS: 60.0000 mg | ORAL_CAPSULE | ORAL | Status: DC

## 2012-03-23 NOTE — Telephone Encounter (Signed)
Ok to write? 

## 2012-03-23 NOTE — Telephone Encounter (Signed)
lmom that rx is ready for pickup.  

## 2012-03-23 NOTE — Telephone Encounter (Signed)
Pended Rx, could one of you please sign for patient?

## 2012-03-23 NOTE — Telephone Encounter (Signed)
Refill done.  

## 2012-04-27 ENCOUNTER — Ambulatory Visit (INDEPENDENT_AMBULATORY_CARE_PROVIDER_SITE_OTHER): Payer: Managed Care, Other (non HMO) | Admitting: Internal Medicine

## 2012-04-27 ENCOUNTER — Encounter: Payer: Self-pay | Admitting: Internal Medicine

## 2012-04-27 VITALS — BP 126/78 | HR 90 | Temp 99.1°F | Resp 16 | Ht 64.75 in | Wt 152.4 lb

## 2012-04-27 DIAGNOSIS — Z639 Problem related to primary support group, unspecified: Secondary | ICD-10-CM

## 2012-04-27 DIAGNOSIS — F988 Other specified behavioral and emotional disorders with onset usually occurring in childhood and adolescence: Secondary | ICD-10-CM

## 2012-04-27 DIAGNOSIS — Z638 Other specified problems related to primary support group: Secondary | ICD-10-CM

## 2012-04-27 DIAGNOSIS — K219 Gastro-esophageal reflux disease without esophagitis: Secondary | ICD-10-CM

## 2012-04-27 MED ORDER — ESOMEPRAZOLE MAGNESIUM 20 MG PO CPDR: DELAYED_RELEASE_CAPSULE | ORAL | Status: AC

## 2012-04-27 MED ORDER — LISDEXAMFETAMINE DIMESYLATE 60 MG PO CAPS: 60.0000 mg | ORAL_CAPSULE | ORAL | Status: DC

## 2012-04-27 NOTE — Progress Notes (Signed)
  Subjective:    Patient ID: Anita Snow, female    DOB: 01-12-1961, 52 y.o.   MRN: [id number]  HPI followup for attention deficit disorder and recent problems with family discord Son in college at [place] was discovered to be abusing alcohol and other drugs and also to be selling/completed Risk manager and 2 other 30 day treatment programs and has progressed to independent living with a roommate under the supervision of [place] [facility]/has a job Camera operator Other son about to graduate from [employer] but without firm idea of the future Grandfather was an alcoholic/1 uncle substance abuse She has found great help from participating in parents support groups She actually discovered a lot of this through his cell phone and face and knows quite a bit about the world he was involved in//this is allowed she and her husband began conversations about their current relationship, with consideration for possible therapy. She has done some therapy on her own and is about to graduate. 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 Adderall tends to be very helpful without side effects As far as GERD is concerned, she has slowly weaned to Nexium 20 daily and hopes to begin every other day soon    Review of Systems No weight loss No excessive fatigue No insomnia No chest pain or shortness of breath Needs a better exercise regimen No appetite loss Continues to be plagued with multiple joint problems especially left knee/right shoulder/DDD neck Asthma and allergic rhinitis are stable     Objective:   Physical Exam Vital signs stable Neurological intact Psychological stable       Assessment & Plan:  ADD (attention deficit disorder) - Plan: lisdexamfetamine (VYVANSE) 60 MG capsule #90 for mail order +14 to fill for the interim  GERD (gastroesophageal reflux disease)-Nexium 20/continue weaning as tolerated  Family  discord-suggestions for family therapy/she is doing very well -working hard  call 3 months for another prescription and followup in 6 months

## 2012-07-13 ENCOUNTER — Telehealth: Payer: Self-pay

## 2012-07-13 DIAGNOSIS — F988 Other specified behavioral and emotional disorders with onset usually occurring in childhood and adolescence: Secondary | ICD-10-CM

## 2012-07-13 NOTE — Telephone Encounter (Signed)
Pended please advise.  

## 2012-07-13 NOTE — Telephone Encounter (Signed)
Patient is saying she needs mail order rx for vyvanse 60 mg please call patient at [phone]

## 2012-07-15 ENCOUNTER — Telehealth: Payer: Self-pay

## 2012-07-15 NOTE — Telephone Encounter (Signed)
Message already sent to Dr Merla Riches.

## 2012-07-15 NOTE — Telephone Encounter (Signed)
Pt is calling about her prescription she states that she called Tuesday. She states that her husband can pick it up for her when it is ready Call back number is [phone]

## 2012-07-15 NOTE — Telephone Encounter (Signed)
Pt is calling to see about picking up prescription so it could be mailed off she called the other day and called earlier she states she is going out of town for a week and is wanting to get it in the mail today She states that her husband can pick it up  Call back number i s [phone]

## 2012-07-18 MED ORDER — LISDEXAMFETAMINE DIMESYLATE 60 MG PO CAPS: 60.0000 mg | ORAL_CAPSULE | ORAL | Status: DC

## 2012-07-18 NOTE — Telephone Encounter (Signed)
Meds ordered this encounter  Medications  . lisdexamfetamine (VYVANSE) 60 MG capsule    Sig: Take 1 capsule (60 mg total) by mouth every morning.    Dispense:  90 capsule    Refill:  0    for mailorder We called

## 2012-07-19 NOTE — Telephone Encounter (Signed)
No, I have not seen this Rx.

## 2012-07-19 NOTE — Telephone Encounter (Signed)
Where did this go? Did you mail ?

## 2012-07-20 MED ORDER — LISDEXAMFETAMINE DIMESYLATE 60 MG PO CAPS: 60.0000 mg | ORAL_CAPSULE | ORAL | Status: DC

## 2012-07-20 NOTE — Telephone Encounter (Signed)
Advised pt that rx is ready for pickup. rx is pickup drawer.

## 2012-07-20 NOTE — Telephone Encounter (Signed)
Note indicates it was called to [facility]

## 2012-07-20 NOTE — Addendum Note (Signed)
Addended by: Tonye Pearson on: 07/20/2012 06:25 PM   Modules accepted: Orders

## 2012-07-20 NOTE — Telephone Encounter (Signed)
Printed again..?

## 2012-07-20 NOTE — Telephone Encounter (Signed)
Dr Merla Riches, do you know where this Rx went after you wrote it, we know that it could not have been called to [facility], but do not see it in drawer or anywhere for your signature.

## 2012-07-26 NOTE — Progress Notes (Signed)
PA approved for Vyvanse through 07/26/13. Pt notified.

## 2012-10-18 ENCOUNTER — Telehealth: Payer: Self-pay

## 2012-10-18 DIAGNOSIS — F988 Other specified behavioral and emotional disorders with onset usually occurring in childhood and adolescence: Secondary | ICD-10-CM

## 2012-10-18 NOTE — Telephone Encounter (Signed)
Pt requesting mail order rx refill for vyvanse 60  Mg   Best phone [phone]

## 2012-10-19 MED ORDER — LISDEXAMFETAMINE DIMESYLATE 60 MG PO CAPS: 60.0000 mg | ORAL_CAPSULE | ORAL | Status: DC

## 2012-10-20 ENCOUNTER — Telehealth: Payer: Self-pay

## 2012-10-20 MED ORDER — LISDEXAMFETAMINE DIMESYLATE 60 MG PO CAPS: 60.0000 mg | ORAL_CAPSULE | ORAL | Status: DC

## 2012-10-20 NOTE — Telephone Encounter (Signed)
She is due for follow up. What is her plan? Left message for her to call me back.

## 2012-10-20 NOTE — Telephone Encounter (Signed)
Thanks. Have sent message to him to advise, have asked for 90 days so she can get mail order. She should keep the appt on Oct 8th

## 2012-10-20 NOTE — Telephone Encounter (Signed)
[place] - Pt spoke to Amy about a refill on her vyvanse.  She has scheduled an appointment with Dr. Merla Riches on October 8.  She wants to get a 30-day refill until she comes in.  [phone]

## 2012-10-20 NOTE — Telephone Encounter (Signed)
Could set her up appt 104 in Dec 1st week for routine f/u

## 2012-10-20 NOTE — Telephone Encounter (Signed)
Sorry, pended 3 months supply, since she uses the mail order. Please advise patient aware she needs follow up and plans to come in.

## 2012-10-20 NOTE — Telephone Encounter (Signed)
I may have signed one of these already but can get the other tomorrow---tho I can't tell if she wants 30 days in addition due to the lag in mail order

## 2012-10-20 NOTE — Telephone Encounter (Signed)
She has 2 weeks supply left. She would like 30 day supply until she can come in. She states she prefers to see you on the weekends, your weekend hours are provided to her. Pended Rx.

## 2012-10-21 NOTE — Telephone Encounter (Signed)
Called patient to advise this is ready for pick up  

## 2012-10-21 NOTE — Telephone Encounter (Signed)
No, she needs the one Rx only. Will call her once it is signed.

## 2012-11-23 ENCOUNTER — Encounter: Payer: Self-pay | Admitting: Internal Medicine

## 2012-11-23 ENCOUNTER — Ambulatory Visit (INDEPENDENT_AMBULATORY_CARE_PROVIDER_SITE_OTHER): Payer: Managed Care, Other (non HMO) | Admitting: Internal Medicine

## 2012-11-23 VITALS — BP 118/73 | HR 81 | Temp 99.0°F | Resp 16 | Ht 64.75 in | Wt 153.0 lb

## 2012-11-23 DIAGNOSIS — N959 Unspecified menopausal and perimenopausal disorder: Secondary | ICD-10-CM

## 2012-11-23 DIAGNOSIS — F988 Other specified behavioral and emotional disorders with onset usually occurring in childhood and adolescence: Secondary | ICD-10-CM

## 2012-11-23 DIAGNOSIS — Z23 Encounter for immunization: Secondary | ICD-10-CM

## 2012-11-23 DIAGNOSIS — J301 Allergic rhinitis due to pollen: Secondary | ICD-10-CM

## 2012-11-23 MED ORDER — FLUTICASONE PROPIONATE 50 MCG/ACT NA SUSP: 2.0000 | Freq: Every day | NASAL | Status: AC

## 2012-11-23 MED ORDER — LISDEXAMFETAMINE DIMESYLATE 60 MG PO CAPS: 60.0000 mg | ORAL_CAPSULE | ORAL | Status: DC

## 2012-11-23 MED ORDER — ALBUTEROL SULFATE HFA 108 (90 BASE) MCG/ACT IN AERS: 2.0000 | INHALATION_SPRAY | Freq: Four times a day (QID) | RESPIRATORY_TRACT | Status: DC | PRN

## 2012-11-23 NOTE — Progress Notes (Signed)
  Subjective:    Patient ID: Anita Snow, female    DOB: 07/24/1960, 52 y.o.   MRN: [id number]  HPIf/u ADD--ocd vyv cont to be helpful  Emotions in flux/sleep disturbed Menopause---just started prozac//Anita Snow ?OCD/ADD might improve minivel w/progr--wt gain/breast engorg   Son at [employer] at prime/pcp Anita Snow Anita Snow helping couns with contract  Review of Systems noncontr x needs ref ar/rad   gerd contr by otc nexium now down to 20 Needs to restart yoga/exer  Objective:   Physical Exam  Constitutional: She is oriented to person, place, and time. She appears well-developed and well-nourished.  Eyes: Conjunctivae and EOM are normal. Pupils are equal, round, and reactive to light.  Cardiovascular: Normal rate and regular rhythm.   Neurological: She is alert and oriented to person, place, and time. No cranial nerve deficit.  Psychiatric: She has a normal mood and affect. Her behavior is normal. Judgment and thought content normal.   BP 118/73  Pulse 81  Temp(Src) 99 F (37.2 C) (Oral)  Resp 16  Ht 5' 4.75" (1.645 m)  Wt 153 lb (69.4 kg)  BMI 25.65 kg/m2  SpO2 99%  LMP 11/13/2012    Assessment & Plan:  Need for prophylactic vaccination and inoculation against influenza - Plan: Flu Vaccine QUAD 36+ mos IM  ADD (attention deficit disorder) - Plan: lisdexamfetamine (VYVANSE) 60 MG capsule  Menopausal and perimenopausal disorder 2014  AR/RAD Meds ordered this encounter  Medications  . fluticasone (FLONASE) 50 MCG/ACT nasal spray    Sig: Place 2 sprays into the nose daily.    Dispense:  16 g    Refill:  5  . albuterol (PROVENTIL HFA;VENTOLIN HFA) 108 (90 BASE) MCG/ACT inhaler    Sig: Inhale 2 puffs into the lungs every 6 (six) hours as needed for wheezing.    Dispense:  1 Inhaler    Refill:  0  . lisdexamfetamine (VYVANSE) 60 MG capsule    Sig: Take 1 capsule (60 mg total) by mouth every morning.    Dispense:  90 capsule    Refill:  0

## 2012-11-24 ENCOUNTER — Other Ambulatory Visit: Payer: Self-pay

## 2012-11-24 MED ORDER — ALBUTEROL SULFATE HFA 108 (90 BASE) MCG/ACT IN AERS: 2.0000 | INHALATION_SPRAY | Freq: Four times a day (QID) | RESPIRATORY_TRACT | Status: DC | PRN

## 2012-11-29 ENCOUNTER — Other Ambulatory Visit: Payer: Self-pay | Admitting: Obstetrics and Gynecology

## 2012-12-30 ENCOUNTER — Ambulatory Visit: Payer: Managed Care, Other (non HMO) | Admitting: Physician Assistant

## 2012-12-30 VITALS — BP 122/82 | HR 78 | Temp 98.5°F | Resp 16 | Ht 65.0 in | Wt 151.0 lb

## 2012-12-30 DIAGNOSIS — R059 Cough, unspecified: Secondary | ICD-10-CM

## 2012-12-30 DIAGNOSIS — J309 Allergic rhinitis, unspecified: Secondary | ICD-10-CM

## 2012-12-30 DIAGNOSIS — R05 Cough: Secondary | ICD-10-CM

## 2012-12-30 MED ORDER — METHYLPREDNISOLONE (PAK) 4 MG PO TABS: ORAL_TABLET | ORAL | Status: DC

## 2012-12-30 MED ORDER — AZITHROMYCIN 250 MG PO TABS: ORAL_TABLET | ORAL | Status: DC

## 2012-12-30 MED ORDER — HYDROCODONE-HOMATROPINE 5-1.5 MG/5ML PO SYRP: 5.0000 mL | ORAL_SOLUTION | Freq: Three times a day (TID) | ORAL | Status: DC | PRN

## 2012-12-30 NOTE — Progress Notes (Signed)
  Subjective:    Patient ID: Anita Snow, female    DOB: 01-16-1961, 52 y.o.   MRN: [id number]  Sore Throat  Associated symptoms include congestion and coughing. Pertinent negatives include no headaches, shortness of breath or vomiting.  Cough Associated symptoms include postnasal drip and a sore throat. Pertinent negatives include no chills, fever, headaches, rhinorrhea, shortness of breath or wheezing.   52 year old female presents for evaluation of PND, sinus pressure, sore throat, and cough x 1 week.  Admits to hx of seasonal allergies, especially in the fall.  Has had a similar illness each fall for the last few years and does attribute it to the seasons changing.  She has seen an allergist in the past who has instructed her to use Qvar, Proair, and Flonase regularly. She does not do this and tends to use them only as needed.  During this illness, she has used her Flonase and Proair only.  Has not been using Qvar.   Denies fever, chills, nausea, vomiting, headache, otalgia, wheezing, SOB, or dizziness Patient is otherwise doing well with no other concerns today.     Review of Systems  Constitutional: Negative for fever and chills.  HENT: Positive for congestion, postnasal drip, sinus pressure and sore throat. Negative for rhinorrhea.   Respiratory: Positive for cough. Negative for chest tightness, shortness of breath and wheezing.   Gastrointestinal: Negative for nausea and vomiting.  Neurological: Negative for dizziness and headaches.       Objective:   Physical Exam  Constitutional: She is oriented to person, place, and time. She appears well-developed and well-nourished.  HENT:  Head: Normocephalic and atraumatic.  Right Ear: Hearing, tympanic membrane, external ear and ear canal normal.  Left Ear: Hearing, tympanic membrane, external ear and ear canal normal.  Mouth/Throat: Uvula is midline and mucous membranes are normal. No oropharyngeal exudate or posterior oropharyngeal  erythema (clear postnasal drainage).  Eyes: Conjunctivae are normal.  Neck: Normal range of motion. Neck supple.  Cardiovascular: Normal rate, regular rhythm and normal heart sounds.   Pulmonary/Chest: Effort normal and breath sounds normal. She has no wheezes.  Lymphadenopathy:    She has no cervical adenopathy.  Neurological: She is alert and oriented to person, place, and time.  Psychiatric: She has a normal mood and affect. Her behavior is normal. Judgment and thought content normal.          Assessment & Plan:  Allergic rhinitis - Plan: methylPREDNIsolone (MEDROL DOSPACK) 4 MG tablet  Cough - Plan: azithromycin (ZITHROMAX) 250 MG tablet, HYDROcodone-homatropine (HYCODAN) 5-1.5 MG/5ML syrup  Start Medrol dose pack Continue Flonase and Proair as directed. May use Qvar if needed If no improvement in 2-3 days, ok to start Zpack Hycodan qhs prn cough Follow up if symptoms worsen or fail to improve.

## 2013-01-29 ENCOUNTER — Telehealth: Payer: Self-pay

## 2013-01-29 DIAGNOSIS — F988 Other specified behavioral and emotional disorders with onset usually occurring in childhood and adolescence: Secondary | ICD-10-CM

## 2013-01-29 MED ORDER — LISDEXAMFETAMINE DIMESYLATE 60 MG PO CAPS: 60.0000 mg | ORAL_CAPSULE | ORAL | Status: DC

## 2013-01-29 NOTE — Telephone Encounter (Signed)
Pt requests refill on [place] - [place] on [place].   Please call pt for details on where. Phone call was a bit confusing.   Tagged as high priority because she states she called Friday but no message was in epic.   bf

## 2013-05-24 ENCOUNTER — Other Ambulatory Visit: Payer: Self-pay

## 2013-05-24 DIAGNOSIS — F988 Other specified behavioral and emotional disorders with onset usually occurring in childhood and adolescence: Secondary | ICD-10-CM

## 2013-05-24 MED ORDER — LISDEXAMFETAMINE DIMESYLATE 60 MG PO CAPS
60.0000 mg | ORAL_CAPSULE | ORAL | Status: DC
Start: 2013-05-24 — End: 2013-05-31

## 2013-05-24 NOTE — Telephone Encounter (Signed)
Pt called/left VM that she has an appt 05/31/13 but that she gets her vyvanse through mail order and if she waits until her appt to get Rx, it will not arrive in time before she runs out. Pt reqs a RF for her to p/up and mail this week. Pended.

## 2013-05-25 NOTE — Telephone Encounter (Signed)
Notified pt Rx ready for p/up. She stated that husband, Jalaina Salyers, will p/up tomorrow.

## 2013-05-31 ENCOUNTER — Telehealth: Payer: Self-pay | Admitting: *Deleted

## 2013-05-31 ENCOUNTER — Encounter: Payer: Self-pay | Admitting: Internal Medicine

## 2013-05-31 ENCOUNTER — Ambulatory Visit (INDEPENDENT_AMBULATORY_CARE_PROVIDER_SITE_OTHER): Payer: Managed Care, Other (non HMO) | Admitting: Internal Medicine

## 2013-05-31 VITALS — BP 124/78 | HR 76 | Temp 98.3°F | Resp 16 | Wt 157.6 lb

## 2013-05-31 DIAGNOSIS — R5381 Other malaise: Secondary | ICD-10-CM

## 2013-05-31 DIAGNOSIS — N959 Unspecified menopausal and perimenopausal disorder: Secondary | ICD-10-CM

## 2013-05-31 DIAGNOSIS — R5383 Other fatigue: Secondary | ICD-10-CM

## 2013-05-31 DIAGNOSIS — R635 Abnormal weight gain: Secondary | ICD-10-CM

## 2013-05-31 DIAGNOSIS — F988 Other specified behavioral and emotional disorders with onset usually occurring in childhood and adolescence: Secondary | ICD-10-CM

## 2013-05-31 LAB — CBC WITH DIFFERENTIAL/PLATELET
Basophils Absolute: 0.1 10*3/uL (ref 0.0–0.1)
Basophils Relative: 1 % (ref 0–1)
Eosinophils Absolute: 0.1 10*3/uL (ref 0.0–0.7)
Eosinophils Relative: 2 % (ref 0–5)
HCT: 36.4 % (ref 36.0–46.0)
Hemoglobin: 12.5 g/dL (ref 12.0–15.0)
Lymphocytes Relative: 27 % (ref 12–46)
Lymphs Abs: 1.8 10*3/uL (ref 0.7–4.0)
MCH: 29.5 pg (ref 26.0–34.0)
MCHC: 34.3 g/dL (ref 30.0–36.0)
MCV: 85.8 fL (ref 78.0–100.0)
Monocytes Absolute: 0.5 10*3/uL (ref 0.1–1.0)
Monocytes Relative: 7 % (ref 3–12)
Neutro Abs: 4.2 10*3/uL (ref 1.7–7.7)
Neutrophils Relative %: 63 % (ref 43–77)
Platelets: 299 10*3/uL (ref 150–400)
RBC: 4.24 MIL/uL (ref 3.87–5.11)
RDW: 13.7 % (ref 11.5–15.5)
WBC: 6.6 10*3/uL (ref 4.0–10.5)

## 2013-05-31 LAB — COMPREHENSIVE METABOLIC PANEL
ALT: 22 U/L (ref 0–35)
AST: 20 U/L (ref 0–37)
Albumin: 4.5 g/dL (ref 3.5–5.2)
Alkaline Phosphatase: 41 U/L (ref 39–117)
BUN: 7 mg/dL (ref 6–23)
CO2: 27 mEq/L (ref 19–32)
Calcium: 9.5 mg/dL (ref 8.4–10.5)
Chloride: 105 mEq/L (ref 96–112)
Creat: 0.88 mg/dL (ref 0.50–1.10)
Glucose, Bld: 90 mg/dL (ref 70–99)
Potassium: 4.6 mEq/L (ref 3.5–5.3)
Sodium: 140 mEq/L (ref 135–145)
Total Bilirubin: 0.5 mg/dL (ref 0.2–1.2)
Total Protein: 6.7 g/dL (ref 6.0–8.3)

## 2013-05-31 LAB — LIPID PANEL
Cholesterol: 209 mg/dL — ABNORMAL HIGH (ref 0–200)
HDL: 63 mg/dL (ref >39)
LDL Cholesterol: 101 mg/dL — ABNORMAL HIGH (ref 0–99)
Total CHOL/HDL Ratio: 3.3 Ratio
Triglycerides: 227 mg/dL — ABNORMAL HIGH (ref <150)
VLDL: 45 mg/dL — ABNORMAL HIGH (ref 0–40)

## 2013-05-31 MED ORDER — LISDEXAMFETAMINE DIMESYLATE 60 MG PO CAPS: 60.0000 mg | ORAL_CAPSULE | ORAL | Status: DC

## 2013-05-31 MED ORDER — WELLBUTRIN XL 150 MG PO TB24: 150.0000 mg | ORAL_TABLET | Freq: Every day | ORAL | Status: DC

## 2013-05-31 NOTE — Progress Notes (Signed)
Subjective:    Patient ID: Anita Snow, female    DOB: Aug 23, 1960, 53 y.o.   MRN: [id number] This chart was scribed for Leandrew Koyanagi, MD by Ladene Artist, ED Scribe. The patient was seen in room 25. Patient's care was started at 2:15 PM.  HPI HPI Comments: Anita Snow is a 53 y.o. female who presents to the [facility] for follow-up.   She plans to obtain certifications in Yoga and TRE (Trauma Releasing Exercises). She states that she is experiencing posttraumatic growth and tries to keep herself busy to eliminate stress. She also participates in the [employer] ([employer] 12 Step Recovery) program that she describes as amazing. She participates in both the leadership training and meetings. Pt states that the program has helped her reach an "aha moment". She further states that her children have noticed a change in her after participating in the program.  She reports weight gain that she suspects is either due to Zoloft or eating a large amount of candy when bored. She states that she does not like grocery shopping or cooking since her children are in college. She also reports a loss of appetite and denies hunger until approximately 6 PM.    Pt also reports being over-focused at times. She states that her house looks like she is preparing to move, although she is not. She attributes this to her difficulty to finish numerous projects that she starts.   Pt began menopause October 2014. Zoloft helps her sleep but does not help with her night sweats. Pt wants to try Wellbutrin. Has some mood issues w/ family stress and wants to see if this provides relief---was on this years ago w/ success but only after changed from generic  Patient Active Problem List   Diagnosis Date Noted  . Menopausal and perimenopausal disorder 2014 11/23/2012  . ADD (attention deficit disorder)---doing well w/ meds 08/05/2011  . AR (allergic rhinitis)---stable f;lonase 08/05/2011  . DDD (degenerative  disc disease), cervical---better w/ yoga 08/05/2011  . Osteoarthritis, shoulder---ditto 08/05/2011  . Osteopenia 08/05/2011  . ESOPHAGEAL REFLUX---stable 03/26/2009  . OSTEOARTHRITIS 03/26/2009    -  Family distress-son /drug abuse----slow impr//alanon//other groups  Review of Systems  Constitutional: Negative for activity change, appetite change and fatigue.       A little wt gain  HENT: Negative for hearing loss and trouble swallowing.   Eyes: Negative for visual disturbance.  Respiratory: Negative for shortness of breath.   Cardiovascular: Negative for chest pain, palpitations and leg swelling.  Gastrointestinal: Negative for abdominal pain.  Genitourinary: Negative for difficulty urinating.  Musculoskeletal: Negative for back pain.  Neurological: Negative for headaches.  Psychiatric/Behavioral: Negative for behavioral problems and sleep disturbance.       Objective:   Physical Exam  Nursing note and vitals reviewed. Constitutional: She is oriented to person, place, and time. She appears well-developed and well-nourished. No distress.  HENT:  Head: Normocephalic and atraumatic.  Eyes: EOM are normal. Pupils are equal, round, and reactive to light.  Neck: Normal range of motion. Neck supple. No thyromegaly present.  Cardiovascular: Normal rate, regular rhythm and normal heart sounds.   Pulmonary/Chest: Effort normal and breath sounds normal. No respiratory distress.  Musculoskeletal: Normal range of motion.  Lymphadenopathy:    She has no cervical adenopathy.  Neurological: She is alert and oriented to person, place, and time. No cranial nerve deficit.  Skin: Skin is warm and dry.  Psychiatric: She has a normal mood and affect.  Her behavior is normal.      Assessment & Plan:  I have completed the patient encounter in its entirety as documented by the scribe, with editing by me where necessary. Robert P. Laney Pastor, M.D.  ADD (attention deficit disorder) - Plan:  lisdexamfetamine (VYVANSE) 60 MG capsule  Other malaise and fatigue - Plan: CBC with Differential, Comprehensive metabolic panel, TSH  Weight gain - Plan: Lipid panel, TSH  Menopausal and perimenopausal disorder 2014  Hx incr LDL  Meds ordered this encounter  Medications  . estradiol (CLIMARA - DOSED IN MG/24 HR) 0.05 mg/24hr patch    Sig: Place 0.05 mg onto the skin once a week.  . lisdexamfetamine (VYVANSE) 60 MG capsule    Sig: Take 1 capsule (60 mg total) by mouth every morning.    Dispense:  14 capsule    Refill:  0    -  Start wellbutrin non generic 150xl and adv to 300  Labs=  Results for orders placed in visit on 05/31/13  CBC WITH DIFFERENTIAL      Result Value Ref Range   WBC 6.6  4.0 - 10.5 K/uL   RBC 4.24  3.87 - 5.11 MIL/uL   Hemoglobin 12.5  12.0 - 15.0 g/dL   HCT 36.4  36.0 - 46.0 %   MCV 85.8  78.0 - 100.0 fL   MCH 29.5  26.0 - 34.0 pg   MCHC 34.3  30.0 - 36.0 g/dL   RDW 13.7  11.5 - 15.5 %   Platelets 299  150 - 400 K/uL   Neutrophils Relative % 63  43 - 77 %   Neutro Abs 4.2  1.7 - 7.7 K/uL   Lymphocytes Relative 27  12 - 46 %   Lymphs Abs 1.8  0.7 - 4.0 K/uL   Monocytes Relative 7  3 - 12 %   Monocytes Absolute 0.5  0.1 - 1.0 K/uL   Eosinophils Relative 2  0 - 5 %   Eosinophils Absolute 0.1  0.0 - 0.7 K/uL   Basophils Relative 1  0 - 1 %   Basophils Absolute 0.1  0.0 - 0.1 K/uL   Smear Review Criteria for review not met    COMPREHENSIVE METABOLIC PANEL      Result Value Ref Range   Sodium 140  135 - 145 mEq/L   Potassium 4.6  3.5 - 5.3 mEq/L   Chloride 105  96 - 112 mEq/L   CO2 27  19 - 32 mEq/L   Glucose, Bld 90  70 - 99 mg/dL   BUN 7  6 - 23 mg/dL   Creat 0.88  0.50 - 1.10 mg/dL   Total Bilirubin 0.5  0.2 - 1.2 mg/dL   Alkaline Phosphatase 41  39 - 117 U/L   AST 20  0 - 37 U/L   ALT 22  0 - 35 U/L   Total Protein 6.7  6.0 - 8.3 g/dL   Albumin 4.5  3.5 - 5.2 g/dL   Calcium 9.5  8.4 - 10.5 mg/dL  LIPID PANEL      Result Value Ref Range     Cholesterol 209 (*) 0 - 200 mg/dL   Triglycerides 227 (*) <150 mg/dL   HDL 63  >39 mg/dL   Total CHOL/HDL Ratio 3.3     VLDL 45 (*) 0 - 40 mg/dL   LDL Cholesterol 101 (*) 0 - 99 mg/dL  TSH      Result Value Ref Range   TSH  0.917  0.350 - 4.500 uIU/mL

## 2013-05-31 NOTE — Telephone Encounter (Signed)
A note was faxed to [facility] to cancel Wellbutrin XL 150 mg because she wanted it E-scribed to [facility] for pick up. Confirmation page received at 3:41 pm.

## 2013-06-01 ENCOUNTER — Telehealth: Payer: Self-pay

## 2013-06-01 LAB — TSH: TSH: 0.917 u[IU]/mL (ref 0.350–4.500)

## 2013-06-01 MED ORDER — WELLBUTRIN XL 300 MG PO TB24: 300.0000 mg | ORAL_TABLET | Freq: Every day | ORAL | Status: DC

## 2013-06-01 MED ORDER — WELLBUTRIN XL 150 MG PO TB24
ORAL_TABLET | ORAL | Status: DC
Start: 2013-06-01 — End: 2013-08-28

## 2013-06-01 NOTE — Telephone Encounter (Signed)
PA needed for name brand Wellbutrin XL 150 and also quantitiy override for quantity. Pharmacist stated that it would only need a PA d/t being for the  name brand if we separate Rxs into the 150 mg for 2 weeks and then for the 300 mg instead of taking 2 tabs of 150 mg.  Changing to two separate Rxs and called [facility] to complete PA since pt is out of her Zoloft which Dr Lyna Poser and needs to start the Wellbutrin. Pt reported she tried Prozac, generic Wellbutrin and Zoloft prev.  PA approved for both strengths of Name Brand through 06/01/14. Notified pharm and pt. Pharm advised pt's co-pay is still $382. Pt is going to look for discount card online bc neither we or pharm has one. Dr Laney Pastor, if pt decides she would like to try generic first instead, is that OK with you?

## 2013-06-04 ENCOUNTER — Encounter: Payer: Self-pay | Admitting: Internal Medicine

## 2013-06-06 NOTE — Telephone Encounter (Signed)
Okay to do whatever she needs to make this prescription work/she requested the name brand due to past experience with failure on the generic

## 2013-06-08 ENCOUNTER — Telehealth: Payer: Self-pay

## 2013-06-08 NOTE — Telephone Encounter (Signed)
This should be fine right?

## 2013-06-08 NOTE — Telephone Encounter (Signed)
If she is tolerating the medication it is fine to increase the dose to 300mg .

## 2013-06-08 NOTE — Telephone Encounter (Signed)
Patient called stated she seen Dr. Laney Pastor on April 15. She want to if she can start the medication Wellbutrin 300 mg tonight. [phone]. It is fine to leave a voicemail.

## 2013-06-09 NOTE — Telephone Encounter (Signed)
LMVM as long as patient is tolerating medication it is fine to increase dose to 300 mg.  Call back with any questions or concerns.

## 2013-06-27 ENCOUNTER — Other Ambulatory Visit: Payer: Self-pay

## 2013-06-27 ENCOUNTER — Other Ambulatory Visit: Payer: Self-pay | Admitting: *Deleted

## 2013-06-27 MED ORDER — WELLBUTRIN XL 300 MG PO TB24: ORAL_TABLET | ORAL | Status: DC

## 2013-06-27 NOTE — Telephone Encounter (Signed)
Pharm called to check status. Looks like when sent earlier was set on "phone in". Resent Rx electronically.

## 2013-08-28 ENCOUNTER — Telehealth: Payer: Self-pay

## 2013-08-28 DIAGNOSIS — F988 Other specified behavioral and emotional disorders with onset usually occurring in childhood and adolescence: Secondary | ICD-10-CM

## 2013-08-28 MED ORDER — LISDEXAMFETAMINE DIMESYLATE 60 MG PO CAPS: 60.0000 mg | ORAL_CAPSULE | ORAL | Status: DC

## 2013-08-28 NOTE — Telephone Encounter (Signed)
Meds ordered this encounter  Medications  . lisdexamfetamine (VYVANSE) 60 MG capsule    Sig: Take 1 capsule (60 mg total) by mouth every morning.    Dispense:  90 capsule    Refill:  0    

## 2013-08-28 NOTE — Telephone Encounter (Signed)
The patient is requesting 90 day rx for mail order of Vyvanse 60mg .  Please call the patient when ready for pick up at [phone].  The patient also stated that she tapered off of Wellbutrin due to the fact it was not working for her.  She wanted Dr. Laney Pastor to be aware.

## 2013-08-29 NOTE — Telephone Encounter (Signed)
LMOM that rx is up front ready for p/u

## 2013-09-12 ENCOUNTER — Ambulatory Visit (INDEPENDENT_AMBULATORY_CARE_PROVIDER_SITE_OTHER): Payer: Managed Care, Other (non HMO) | Admitting: Internal Medicine

## 2013-09-12 VITALS — BP 142/88 | HR 94 | Temp 98.7°F | Resp 17 | Ht 65.0 in | Wt 154.0 lb

## 2013-09-12 DIAGNOSIS — R432 Parageusia: Secondary | ICD-10-CM

## 2013-09-12 DIAGNOSIS — R630 Anorexia: Secondary | ICD-10-CM

## 2013-09-12 DIAGNOSIS — R439 Unspecified disturbances of smell and taste: Secondary | ICD-10-CM

## 2013-09-12 DIAGNOSIS — R220 Localized swelling, mass and lump, head: Secondary | ICD-10-CM

## 2013-09-12 DIAGNOSIS — R221 Localized swelling, mass and lump, neck: Secondary | ICD-10-CM

## 2013-09-12 LAB — POCT SKIN KOH: Skin KOH, POC: NEGATIVE

## 2013-09-12 NOTE — Progress Notes (Signed)
Subjective:  This chart was scribed for Tami Lin, MD by Donato Schultz, Medical Scribe. This patient was seen in Room 8 and the patient's care was started at 6:05 PM.   Patient ID: Anita Snow, female    DOB: Dec 10, 1960, 53 y.o.   MRN: [id number]  HPI HPI Comments: Anita Snow is a 53 y.o. female with a history of shellfish allergy who presents to the [facility] complaining of intermittent tongue swelling that started three months ago.  She experienced her symptoms in April after she ate strawberries.  She did not experience any relief to her symptoms after she cut strawberries from her diet and changed her toothpaste.  Her tongue swelling has not resolved and is worse in the morning.  She is not very hungry lately and will eat crackers and other light meals throughout the day.  She has not lost any weight.  She denies trouble swallowing while eating, choking episodes, and hoarseness as associated symptoms.  She feels as though her mouth has been more dry and her taste is off.  Her sense of smell has not been affected.  She notices that she is snoring more often but does not feel tired.  She has not had any allergies but she is still using Flonase.  She will get a sinus headache before a rain storm but takes Tylenol Sinus to treat her symptoms.  She is still taking Nexium and is not experiencing heartburn.  She is no longer taking Wellbutrin because it was affecting her memory.  The last time she used QVAR was a year ago.  She is still taking Vyvanse and Prometrium.  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  She had two veneers placed in January and has not spoken to her dentist about her symptoms.    Past Medical History  Diagnosis Date  . Arthritis   . GERD (gastroesophageal reflux disease)   . Hiatal hernia   . Allergy    Past Surgical History  Procedure Laterality Date  . Shoulder surgery    . Cesarean section      x2   Family  History  Problem Relation Age of Onset  . Heart disease Father   . Colon cancer Neg Hx   . Arthritis Mother   . Cancer Maternal Grandmother     lung  . COPD Maternal Grandfather     emphysema  . Heart disease Paternal Grandfather    History   Social History  . Marital Status: Married    Spouse Name: N/A    Number of Children: 2  . Years of Education: N/A   Occupational History  . Teacher    Social History Main Topics  . Smoking status: Never Smoker   . Smokeless tobacco: Never Used  . Alcohol Use: Yes     Comment: twice a moth 2 drinks  . Drug Use: No  . Sexual Activity: Yes    Partners: Male    Birth Control/ Protection: Condom   Other Topics Concern  . Not on file   Social History Narrative  . No narrative on file   Allergies  Allergen Reactions  . Cephalexin   . Shellfish Allergy     Lip swells and hands swell  8 to 10 hours after being around it     Review of Systems  HENT: Positive for postnasal drip. Negative for trouble swallowing and voice change.      Objective:  Physical Exam  Nursing note and vitals reviewed. Constitutional: She is oriented to person, place, and time. She appears well-developed and well-nourished.  HENT:  Head: Normocephalic and atraumatic.  Right Ear: External ear normal.  Left Ear: External ear normal.  Nose: Nose normal.  Mouth/Throat: Oropharynx is clear and moist. No oropharyngeal exudate.  Eyes: EOM are normal.  Neck: Normal range of motion. Neck supple. No thyromegaly present.  Cardiovascular: Normal rate.   Pulmonary/Chest: Effort normal.  Musculoskeletal: Normal range of motion.  Lymphadenopathy:       Head (right side): No submental, no submandibular, no tonsillar, no preauricular, no posterior auricular and no occipital adenopathy present.       Head (left side): No submental, no submandibular, no tonsillar, no preauricular, no posterior auricular and no occipital adenopathy present.    She has no cervical  adenopathy.  Neurological: She is alert and oriented to person, place, and time.  Skin: Skin is warm and dry.  Psychiatric: She has a normal mood and affect. Her behavior is normal.   Results for orders placed in visit on 09/12/13  POCT SKIN KOH      Result Value Ref Range   Skin KOH, POC Negative      BP 142/88  Pulse 94  Temp(Src) 98.7 F (37.1 C) (Oral)  Resp 17  Ht 5\' 5"  (1.651 m)  Wt 154 lb (69.854 kg)  BMI 25.63 kg/m2  SpO2 100%  LMP 11/13/2012 Assessment & Plan:    I have completed the patient encounter in its entirety as documented by the scribe, with editing by me where necessary. Estefanie Cornforth P. Laney Pastor, M.D. Tongue swelling---?etiology/doesn't follow allergic pattern  To have dental eval next week then will ref to ent if no answer-?needs scoping  Loss of appetite  Loss of taste -

## 2013-12-01 ENCOUNTER — Other Ambulatory Visit: Payer: Self-pay

## 2013-12-11 ENCOUNTER — Telehealth: Payer: Self-pay

## 2013-12-11 DIAGNOSIS — F988 Other specified behavioral and emotional disorders with onset usually occurring in childhood and adolescence: Secondary | ICD-10-CM

## 2013-12-11 MED ORDER — LISDEXAMFETAMINE DIMESYLATE 60 MG PO CAPS: 60.0000 mg | ORAL_CAPSULE | ORAL | Status: DC

## 2013-12-11 NOTE — Telephone Encounter (Signed)
The patient called to request an interim 30 day rx for Vyvanse 60mg .  The patient stated that she will run out within the week and typically uses a mail order service, but would like to be able to take this rx to the local pharmacy.  The patient stated she believes she is due for an office visit, where she would be happy to do the 90 day mail order rx at that time.  Please call the patient at [phone] when the rx is ready for pick up or with any questions.

## 2013-12-12 NOTE — Telephone Encounter (Signed)
LM to advise rx will be ready 10/28

## 2013-12-18 ENCOUNTER — Other Ambulatory Visit: Payer: Self-pay | Admitting: Obstetrics and Gynecology

## 2013-12-19 LAB — CYTOLOGY - PAP

## 2014-01-16 ENCOUNTER — Telehealth: Payer: Self-pay

## 2014-01-16 DIAGNOSIS — F988 Other specified behavioral and emotional disorders with onset usually occurring in childhood and adolescence: Secondary | ICD-10-CM

## 2014-01-16 MED ORDER — LISDEXAMFETAMINE DIMESYLATE 60 MG PO CAPS: 60.0000 mg | ORAL_CAPSULE | ORAL | Status: DC

## 2014-01-16 NOTE — Telephone Encounter (Signed)
Pt states she is in need of her VYVANSE 60 mgs. Please call [phone] She have made an appt for the end of the month

## 2014-01-17 NOTE — Telephone Encounter (Signed)
Notified pt on VM Rx is ready. 

## 2014-02-14 ENCOUNTER — Ambulatory Visit (INDEPENDENT_AMBULATORY_CARE_PROVIDER_SITE_OTHER): Payer: Managed Care, Other (non HMO) | Admitting: Internal Medicine

## 2014-02-14 VITALS — BP 165/90 | HR 105 | Temp 98.1°F | Resp 18 | Ht 65.5 in | Wt 156.0 lb

## 2014-02-14 DIAGNOSIS — M5431 Sciatica, right side: Secondary | ICD-10-CM

## 2014-02-14 DIAGNOSIS — F909 Attention-deficit hyperactivity disorder, unspecified type: Secondary | ICD-10-CM

## 2014-02-14 DIAGNOSIS — N951 Menopausal and female climacteric states: Secondary | ICD-10-CM

## 2014-02-14 DIAGNOSIS — K219 Gastro-esophageal reflux disease without esophagitis: Secondary | ICD-10-CM

## 2014-02-14 DIAGNOSIS — R232 Flushing: Secondary | ICD-10-CM

## 2014-02-14 DIAGNOSIS — M79674 Pain in right toe(s): Secondary | ICD-10-CM

## 2014-02-14 DIAGNOSIS — G8929 Other chronic pain: Secondary | ICD-10-CM

## 2014-02-14 DIAGNOSIS — F988 Other specified behavioral and emotional disorders with onset usually occurring in childhood and adolescence: Secondary | ICD-10-CM

## 2014-02-14 DIAGNOSIS — Z23 Encounter for immunization: Secondary | ICD-10-CM

## 2014-02-14 MED ORDER — LISDEXAMFETAMINE DIMESYLATE 60 MG PO CAPS: 60.0000 mg | ORAL_CAPSULE | ORAL | Status: DC

## 2014-02-14 MED ORDER — ESOMEPRAZOLE MAGNESIUM 10 MG PO PACK: 10.0000 mg | PACK | Freq: Every day | ORAL | Status: DC

## 2014-02-14 NOTE — Progress Notes (Signed)
   Subjective:    Patient ID: Anita Snow, female    DOB: [birth year], 53 y.o.   MRN: [id number]  HPIf/u ADD--- doing well with Vyvanse 60 mg//needs a prescription for now and a mail order prescription for 90 days//no side effects  Dysthymia secondary to family stress--continues with counseling for herself in the aftermath of her son's substance abuse disorder--he is better but not completely controlled. There has been some family counseling which her husband has been reluctant to do and he is having some difficulty absorbing all of her changes. She has become certified in 12 step yoga and is now working on tension release methods and yoga Designer, television/film set. She is recovering from a family upbringing where her father was alcoholic and yelled all the time and her mother was completely passive and pretended there were no problems.  Hot flashes-GYN recently restarted Zoloft but she does not like the way these medicines make her feel and so she will continue with just hormone therapy  GERD-has continued improve meant--- is ready to begin to wean Nexium and would like to start with 20/10/20/10 and progress from there  Toe pain--ongoing problem with chronic pain at the right first metatarsal and the dorsum of the foot treated by podiatrist Dr. Prudence Davidson. He is at a point following orthotics and injections and Lyrica where he is recommending surgery but she is reluctant. She has had problems with the right side for a long time. Status post knee surgery/arthroscopy for debris. Long history of a very tight right hip. Recurrent right sciatica with bending activities like vacuuming.    Review of Systems no headaches    Or vision changes  No chest pain or palpitations  No weight loss  No tremor  No rashes   Objective:   Physical Exam BP 165/90 mmHg  Pulse 105  Temp(Src) 98.1 F (36.7 C)  Resp 18  Ht 5' 5.5" (1.664 m)  Wt 156 lb (70.761 kg)  BMI 25.56 kg/m2  SpO2 98%  LMP 11/13/2012  anxious  today (home blood pressures have been normal)   HEENT clear   she has tenderness in the right sciatic area with decreased range of motion in the right hip compared to the left   straight leg raise is normal without radicular symptoms The right great toe is tender along the dorsum There is a knot along the right outer calf adjacent to the fibula that is tender The toe has a good range of motion without pain and there is no deformity at the first MTP  Mood is good and affect is appropriate with good thought content and sound judgment       Assessment & Plan:  ADD (attention deficit disorder) - Plan: lisdexamfetamine (VYVANSE) 60 MG capsule  Need for prophylactic vaccination and inoculation against influenza - Plan: Flu Vaccine QUAD 36+ mos IM  Sciatica neuralgia, right - Plan: Ambulatory referral to Physical Therapy-[facility]  Gastroesophageal reflux disease without esophagitis  After transition to lower doses of PPIs she will switch to H2 blockers  Hot flashes  E patch Chronic pain of toe of right foot  Physical therapy will have comments about this as well  Family stress --she is doing very well and will continue her current efforts  Follow-up 6 months

## 2014-02-15 ENCOUNTER — Telehealth: Payer: Self-pay

## 2014-02-15 NOTE — Telephone Encounter (Signed)
PA approved for Nexium 10 packets for pt to use to wean off of Nexium 20. She has tried omeprazole and prevacid in the past and they caused HAs. Approved through 02/15/15, case # [id number].

## 2014-04-18 ENCOUNTER — Telehealth: Payer: Self-pay | Admitting: Internal Medicine

## 2014-04-18 ENCOUNTER — Other Ambulatory Visit: Payer: Self-pay | Admitting: Internal Medicine

## 2014-04-18 MED ORDER — LISDEXAMFETAMINE DIMESYLATE 60 MG PO CAPS: 60.0000 mg | ORAL_CAPSULE | ORAL | Status: DC

## 2014-04-18 NOTE — Telephone Encounter (Signed)
Notified patient that her new script is ready and available for pick up at [facility]. She states that her husband may be coming to pick it up.

## 2014-04-18 NOTE — Telephone Encounter (Signed)
Patient came to [facility] appointment center and presented with a script and pill bottle for Vyvanse. Patient states that she is unable to fill her medication for this month due to how it was written (states she normally gets a 90 day supply however this time it was written for 30 day supply?) Patient would like to pick up the corrected prescription today if possible (states her husband will come by) otherwise she can wait until in the morning to get it. I have placed this phone message, the script, and the pill bottle at the nurses station at 104. Please advise.   [phone]

## 2014-05-20 ENCOUNTER — Ambulatory Visit (INDEPENDENT_AMBULATORY_CARE_PROVIDER_SITE_OTHER): Payer: Managed Care, Other (non HMO) | Admitting: Internal Medicine

## 2014-05-20 VITALS — BP 159/84 | HR 83 | Temp 98.2°F | Resp 16 | Ht 64.75 in | Wt 153.4 lb

## 2014-05-20 DIAGNOSIS — K219 Gastro-esophageal reflux disease without esophagitis: Secondary | ICD-10-CM

## 2014-05-20 DIAGNOSIS — F909 Attention-deficit hyperactivity disorder, unspecified type: Secondary | ICD-10-CM | POA: Diagnosis not present

## 2014-05-20 DIAGNOSIS — F988 Other specified behavioral and emotional disorders with onset usually occurring in childhood and adolescence: Secondary | ICD-10-CM

## 2014-05-20 MED ORDER — LISDEXAMFETAMINE DIMESYLATE 60 MG PO CAPS: 60.0000 mg | ORAL_CAPSULE | ORAL | Status: DC

## 2014-05-20 MED ORDER — FAMOTIDINE 20 MG PO TABS: 20.0000 mg | ORAL_TABLET | Freq: Two times a day (BID) | ORAL | Status: DC

## 2014-05-20 NOTE — Progress Notes (Signed)
Subjective:    Patient ID: Mora Bellman, female    DOB: [birth year], 54 y.o.   MRN: [id number] This chart was scribed for Tami Lin, MD by Marti Sleigh, Medical Scribe. This patient was seen in Room 10 and the patient's care was started a 3:28 PM.  Chief Complaint  Patient presents with  . Medication Refill    HPI HPI Comments: LURDES HALTIWANGER is a 54 y.o. female who presents to [facility] reporting for a medication refill. ADD/GERD  Pt reports continuing to try to get off medications for GERD. Pt states she has tried powdered Nexium and zantac which both gave her a severe HA. Pt states she would like to get off her medications. Pt states she would like get off her nexium.   Pt states her mother has recently developed dementia or alzheimer's. Lives in [place].  Except for insurance problems with coverage and delivery she is doing well with her ADD meds at this point Prior to Admission medications   Medication Sig Start Date End Date Taking? Authorizing Provider  albuterol (PROVENTIL HFA;VENTOLIN HFA) 108 (90 BASE) MCG/ACT inhaler Inhale 2 puffs into the lungs every 6 (six) hours as needed for wheezing. 11/24/12  only needs this occasionally during allergy season  Yes Leandrew Koyanagi, MD  cycloSPORINE (RESTASIS) 0.05 % ophthalmic emulsion Place 1 drop into both eyes 2 (two) times daily.   Yes Historical Provider, MD  esomeprazole (NEXIUM) 20 MG capsule TAKE 1 CAPSULE DAILY 04/27/12  Yes Leandrew Koyanagi, MD  estradiol (CLIMARA - DOSED IN MG/24 HR) 0.05 mg/24hr patch Place 0.05 mg onto the skin once a week.   Yes Historical Provider, MD  fluticasone (FLONASE) 50 MCG/ACT nasal spray Place 2 sprays into the nose daily. 11/23/12  Yes Leandrew Koyanagi, MD  lisdexamfetamine (VYVANSE) 60 MG capsule Take 1 capsule (60 mg total) by mouth every morning. 02/14/14  Yes Leandrew Koyanagi, MD  MINIVELLE 0.05 MG/24HR patch  01/28/14  Yes Historical Provider, MD  progesterone (PROMETRIUM) 100 MG  capsule Take 100 mg by mouth daily.   Yes Historical Provider, MD     Review of Systems  All other systems reviewed and are negative.      Objective:   Physical Exam  Constitutional: She is oriented to person, place, and time. She appears well-developed and well-nourished. No distress.  HENT:  Head: Normocephalic and atraumatic.  Eyes: Pupils are equal, round, and reactive to light.  Neck: Neck supple.  Cardiovascular: Normal rate.   Pulmonary/Chest: Effort normal. No respiratory distress.  Musculoskeletal: Normal range of motion.  Neurological: She is alert and oriented to person, place, and time. Coordination normal.  Skin: Skin is warm and dry. She is not diaphoretic.  Psychiatric: She has a normal mood and affect. Her behavior is normal.  Nursing note and vitals reviewed.      Assessment & Plan:  ADD (attention deficit disorder) - Plan: lisdexamfetamine (VYVANSE) 60 MG capsule  Gastroesophageal reflux disease without esophagitis  Meds ordered this encounter  Medications  . lisdexamfetamine (VYVANSE) 60 MG capsule    Sig: Take 1 capsule (60 mg total) by mouth every morning.    Dispense:  30 capsule    Refill:  0  . lisdexamfetamine (VYVANSE) 60 MG capsule    Sig: Take 1 capsule (60 mg total) by mouth every morning.    Dispense:  90 capsule    Refill:  0  . famotidine (PEPCID) 20 MG tablet    Sig:  Take 1 tablet (20 mg total) by mouth 2 (two) times daily.    Dispense:  180 tablet    Refill:  3   she can then wean off Pepcid once controlled  I have completed the patient encounter in its entirety as documented by the scribe, with editing by me where necessary. Wendy Mikles P. Laney Pastor, M.D.

## 2014-06-06 ENCOUNTER — Telehealth: Payer: Self-pay

## 2014-06-06 NOTE — Telephone Encounter (Signed)
I called [employer] and they will fax new form to fill out.

## 2014-06-06 NOTE — Telephone Encounter (Signed)
Patient husband dropped off a authorization form from [place]. A note is attached from the patient. I will place the form with Elwyn Reach. Thanks.

## 2014-06-07 NOTE — Telephone Encounter (Signed)
Completed PA form and faxed to [employer]. Pending.

## 2014-07-27 ENCOUNTER — Encounter: Payer: Self-pay | Admitting: *Deleted

## 2014-09-03 ENCOUNTER — Telehealth: Payer: Self-pay

## 2014-09-03 ENCOUNTER — Other Ambulatory Visit: Payer: Self-pay | Admitting: Physician Assistant

## 2014-09-03 MED ORDER — LISDEXAMFETAMINE DIMESYLATE 60 MG PO CAPS: 60.0000 mg | ORAL_CAPSULE | ORAL | Status: DC

## 2014-09-03 NOTE — Telephone Encounter (Signed)
Meds ordered this encounter  Medications  . lisdexamfetamine (VYVANSE) 60 MG capsule    Sig: Take 1 capsule (60 mg total) by mouth every morning.    Dispense:  90 capsule    Refill:  0

## 2014-09-03 NOTE — Telephone Encounter (Signed)
Pt is asking for a new rx of vyvanse  60 mg for mail order please call her once ready to pick up the rx

## 2014-09-04 NOTE — Telephone Encounter (Signed)
Rx in pick up draw. Pt notified. 

## 2014-12-06 ENCOUNTER — Telehealth: Payer: Self-pay | Admitting: Internal Medicine

## 2014-12-06 NOTE — Telephone Encounter (Signed)
PT req refill on lisdexamfetamine (VYVANSE) 60 MG capsule; please call when ready for pick up.   [phone]

## 2014-12-07 MED ORDER — LISDEXAMFETAMINE DIMESYLATE 60 MG PO CAPS: 60.0000 mg | ORAL_CAPSULE | ORAL | Status: DC

## 2014-12-07 NOTE — Telephone Encounter (Signed)
Awaiting signature.

## 2014-12-10 NOTE — Telephone Encounter (Signed)
Rx in pick up draw. Left message letting pt know.

## 2015-01-02 ENCOUNTER — Telehealth: Payer: Self-pay | Admitting: Internal Medicine

## 2015-01-02 NOTE — Telephone Encounter (Signed)
Spoke with patient and she has had RUQ pain, nausea, vomiting and diarrhea for several days. She has gone to clear liquids and is keeping this down. She states she had had similar "attacks" in the past. Scheduled on Friday with Alonza Bogus, PA at 3:00 PM.

## 2015-01-04 ENCOUNTER — Other Ambulatory Visit (INDEPENDENT_AMBULATORY_CARE_PROVIDER_SITE_OTHER): Payer: Managed Care, Other (non HMO)

## 2015-01-04 ENCOUNTER — Ambulatory Visit (INDEPENDENT_AMBULATORY_CARE_PROVIDER_SITE_OTHER): Payer: Managed Care, Other (non HMO) | Admitting: Gastroenterology

## 2015-01-04 ENCOUNTER — Encounter: Payer: Self-pay | Admitting: Gastroenterology

## 2015-01-04 VITALS — BP 142/80 | HR 72 | Ht 64.75 in | Wt 163.0 lb

## 2015-01-04 DIAGNOSIS — R112 Nausea with vomiting, unspecified: Secondary | ICD-10-CM

## 2015-01-04 DIAGNOSIS — R1084 Generalized abdominal pain: Secondary | ICD-10-CM

## 2015-01-04 DIAGNOSIS — R140 Abdominal distension (gaseous): Secondary | ICD-10-CM

## 2015-01-04 DIAGNOSIS — R197 Diarrhea, unspecified: Secondary | ICD-10-CM

## 2015-01-04 LAB — IGA: IgA: 126 mg/dL (ref 68–378)

## 2015-01-04 MED ORDER — SACCHAROMYCES BOULARDII 250 MG PO CAPS: 250.0000 mg | ORAL_CAPSULE | Freq: Two times a day (BID) | ORAL | Status: DC

## 2015-01-04 MED ORDER — HYOSCYAMINE SULFATE 0.125 MG SL SUBL: 0.1250 mg | SUBLINGUAL_TABLET | Freq: Four times a day (QID) | SUBLINGUAL | Status: DC | PRN

## 2015-01-04 NOTE — Patient Instructions (Signed)
Please go to the basement level to have your labs drawn.   We sent a prescription for Levsin sublingual to [facility] [address].  Also sent a prescription for Florastor twice daily.  We have given you a FODMAP diet.

## 2015-01-08 LAB — TISSUE TRANSGLUTAMINASE, IGG: Tissue Transglut Ab: 1 U/mL (ref <6)

## 2015-01-14 ENCOUNTER — Encounter: Payer: Self-pay | Admitting: Internal Medicine

## 2015-01-20 ENCOUNTER — Encounter: Payer: Self-pay | Admitting: Gastroenterology

## 2015-01-20 DIAGNOSIS — R140 Abdominal distension (gaseous): Secondary | ICD-10-CM | POA: Insufficient documentation

## 2015-01-20 DIAGNOSIS — R1084 Generalized abdominal pain: Secondary | ICD-10-CM | POA: Insufficient documentation

## 2015-01-20 DIAGNOSIS — R112 Nausea with vomiting, unspecified: Secondary | ICD-10-CM | POA: Insufficient documentation

## 2015-01-20 DIAGNOSIS — R197 Diarrhea, unspecified: Secondary | ICD-10-CM | POA: Insufficient documentation

## 2015-01-20 NOTE — Progress Notes (Signed)
01/04/2015 Anita Snow PH:[id number] 21-Mar-1960   HISTORY OF PRESENT ILLNESS: this is a 54 year old female who is known to Dr. Olevia Perches.  Her care will be assumed by Dr. Silverio Decamp in Dr. Nichola Sizer absence.  Colonoscopy 04/2009 was normal with repeat recommended in 10 years.  EGD at the same time showed only hiatal hernia; gastric biopsies without Hpylori and only mild chronic gastritis.  GE junction biopsies show mild inflammation c/w GERD.  She presents to our office today with recurrent complaints of non-specific abdominal pain and bloating with intermittent nausea and episodic vomiting.  Also some diarrhea on occasion as well.  Mostly pain is in RUQ, but feels bloated and uncomfortable all over when symptoms occur.  says taht stool is sometimes yellowish-green color.  Was seen here for similar complaint in 2012 and evaluated in 2007 for these symptoms also.  Is on omeprazole 20 mg daily.  She's had 2 ultrasounds performed (2007 and 2012) for the same symptoms that she presents with today; both showed multiple stable hepatic cysts.  Also HIDA scan in 2007 as well, which was negative.   Past Medical History  Diagnosis Date  . Arthritis   . GERD (gastroesophageal reflux disease)   . Hiatal hernia   . Allergy    Past Surgical History  Procedure Laterality Date  . Shoulder surgery    . Cesarean section      x2    reports that she has never smoked. She has never used smokeless tobacco. She reports that she drinks alcohol. She reports that she does not use illicit drugs. family history includes Arthritis in her mother; COPD in her maternal grandfather; Cancer in her maternal grandmother; Heart disease in her father and paternal grandfather. There is no history of Colon cancer. Allergies  Allergen Reactions  . Cephalexin   . Shellfish Allergy     Lip swells and hands swell  8 to 10 hours after being around it      Outpatient Encounter Prescriptions as of 01/04/2015  Medication Sig    . albuterol (PROVENTIL HFA;VENTOLIN HFA) 108 (90 BASE) MCG/ACT inhaler Inhale 2 puffs into the lungs every 6 (six) hours as needed for wheezing.  . cycloSPORINE (RESTASIS) 0.05 % ophthalmic emulsion Place 1 drop into both eyes 2 (two) times daily.  Marland Kitchen esomeprazole (NEXIUM) 20 MG capsule TAKE 1 CAPSULE DAILY  . estradiol (CLIMARA - DOSED IN MG/24 HR) 0.05 mg/24hr patch Place 0.05 mg onto the skin once a week.  . fluticasone (FLONASE) 50 MCG/ACT nasal spray Place 2 sprays into the nose daily.  Marland Kitchen lisdexamfetamine (VYVANSE) 60 MG capsule Take 1 capsule (60 mg total) by mouth every morning.  Marland Kitchen MINIVELLE 0.05 MG/24HR patch   . progesterone (PROMETRIUM) 100 MG capsule Take 100 mg by mouth daily.  . [DISCONTINUED] famotidine (PEPCID) 20 MG tablet Take 1 tablet (20 mg total) by mouth 2 (two) times daily.  . hyoscyamine (LEVSIN/SL) 0.125 MG SL tablet Place 1 tablet (0.125 mg total) under the tongue every 6 (six) hours as needed.  . saccharomyces boulardii (FLORASTOR) 250 MG capsule Take 1 capsule (250 mg total) by mouth 2 (two) times daily.   No facility-administered encounter medications on file as of 01/04/2015.     REVIEW OF SYSTEMS  : All other systems reviewed and negative except where noted in the History of Present Illness.   PHYSICAL EXAM: BP 142/80 mmHg  Pulse 72  Ht 5' 4.75" (1.645 m)  Wt 163 lb (73.936  kg)  BMI 27.32 kg/m2  LMP 11/13/2012 General: Well developed female in no acute distress Head: Normocephalic and atraumatic Eyes:  Sclerae anicteric, conjunctiva pink. Ears: Normal auditory acuity Lungs: Clear throughout to auscultation Heart: Regular rate and rhythm Abdomen: Soft, non-distended.  Normal bowel sounds.  Mild diffuse TTP. Musculoskeletal: Symmetrical with no gross deformities  Skin: No lesions on visible extremities Extremities: No edema  Neurological: Alert oriented x 4, grossly non-focal Psychological:  Alert and cooperative. Normal mood and affect  ASSESSMENT  AND PLAN: -Recurrent complaints of abdominal pain, bloating, nausea, vomiting, diarrhea.  She's had the same complaints on multiple occasions in the past.  Symptoms are intermittent.  Evaluation has been negative thus far.  Likely some IBS.  Will check celiac labs.  Recommended FODMAP diet, which she is willing to try.  Will have her start Florastor twice daily and will give Levsin to use prn as well.  If symptoms continue then only other evaluation to perform would be CT scan.  CC:  Leandrew Koyanagi, MD

## 2015-01-21 NOTE — Progress Notes (Signed)
Reviewed and agree with documentation and assessment and plan. K. Veena Sosaia Pittinger , MD   

## 2015-03-12 ENCOUNTER — Other Ambulatory Visit: Payer: Self-pay | Admitting: Obstetrics and Gynecology

## 2015-03-12 DIAGNOSIS — N644 Mastodynia: Secondary | ICD-10-CM

## 2015-03-20 ENCOUNTER — Ambulatory Visit
Admission: RE | Admit: 2015-03-20 | Discharge: 2015-03-20 | Disposition: A | Discharge status: Home / Self Care | Payer: Managed Care, Other (non HMO) | Source: Ambulatory Visit | Attending: Obstetrics and Gynecology | Admitting: Obstetrics and Gynecology

## 2015-03-20 DIAGNOSIS — N644 Mastodynia: Secondary | ICD-10-CM

## 2015-04-07 ENCOUNTER — Telehealth: Payer: Self-pay

## 2015-04-07 NOTE — Telephone Encounter (Signed)
PATIENT WOULD LIKE DR. DOOLITTLE TO WRITE HER A 30 DAY SUPPLY OF VYVANSE 60 MG. SHE HAS AN APPOINTMENT TO SEE HIM ON May 08, 2015, SO SHE JUST NEEDS ENOUGH TO LAST HER UNTIL THEN. PLEASE CALL HER WHEN THE PRESCRIPTION HAS BEEN WRITTEN. BEST PHONE [phone] (CELL)  [place]

## 2015-04-08 MED ORDER — LISDEXAMFETAMINE DIMESYLATE 60 MG PO CAPS: 60.0000 mg | ORAL_CAPSULE | ORAL | Status: DC

## 2015-04-08 NOTE — Telephone Encounter (Signed)
Meds ordered this encounter  Medications   lisdexamfetamine (VYVANSE) 60 MG capsule    Sig: Take 1 capsule (60 mg total) by mouth every morning.    Dispense:  30 capsule    Refill:  0    

## 2015-04-09 NOTE — Telephone Encounter (Signed)
Notified pt ready on VM.

## 2015-04-17 HISTORY — PX: OTHER SURGICAL HISTORY: SHX169

## 2015-05-08 ENCOUNTER — Ambulatory Visit (INDEPENDENT_AMBULATORY_CARE_PROVIDER_SITE_OTHER): Payer: Managed Care, Other (non HMO) | Admitting: Internal Medicine

## 2015-05-08 ENCOUNTER — Encounter: Payer: Self-pay | Admitting: Internal Medicine

## 2015-05-08 VITALS — BP 134/82 | HR 70 | Temp 97.9°F | Resp 16 | Ht 64.5 in | Wt 161.0 lb

## 2015-05-08 DIAGNOSIS — Z0000 Encounter for general adult medical examination without abnormal findings: Secondary | ICD-10-CM | POA: Diagnosis not present

## 2015-05-08 DIAGNOSIS — F909 Attention-deficit hyperactivity disorder, unspecified type: Secondary | ICD-10-CM | POA: Diagnosis not present

## 2015-05-08 DIAGNOSIS — F988 Other specified behavioral and emotional disorders with onset usually occurring in childhood and adolescence: Secondary | ICD-10-CM

## 2015-05-08 LAB — CBC WITH DIFFERENTIAL/PLATELET
Basophils Absolute: 0.1 10*3/uL (ref 0.0–0.1)
Basophils Relative: 1 % (ref 0–1)
Eosinophils Absolute: 0.1 10*3/uL (ref 0.0–0.7)
Eosinophils Relative: 2 % (ref 0–5)
HCT: 39.1 % (ref 36.0–46.0)
Hemoglobin: 13.1 g/dL (ref 12.0–15.0)
Lymphocytes Relative: 29 % (ref 12–46)
Lymphs Abs: 2.1 10*3/uL (ref 0.7–4.0)
MCH: 29.1 pg (ref 26.0–34.0)
MCHC: 33.5 g/dL (ref 30.0–36.0)
MCV: 86.9 fL (ref 78.0–100.0)
MPV: 10.9 fL (ref 8.6–12.4)
Monocytes Absolute: 0.6 10*3/uL (ref 0.1–1.0)
Monocytes Relative: 8 % (ref 3–12)
Neutro Abs: 4.4 10*3/uL (ref 1.7–7.7)
Neutrophils Relative %: 60 % (ref 43–77)
Platelets: 334 10*3/uL (ref 150–400)
RBC: 4.5 MIL/uL (ref 3.87–5.11)
RDW: 13.3 % (ref 11.5–15.5)
WBC: 7.3 10*3/uL (ref 4.0–10.5)

## 2015-05-08 LAB — COMPREHENSIVE METABOLIC PANEL
ALT: 17 U/L (ref 6–29)
AST: 16 U/L (ref 10–35)
Albumin: 4.6 g/dL (ref 3.6–5.1)
Alkaline Phosphatase: 48 U/L (ref 33–130)
BUN: 9 mg/dL (ref 7–25)
CO2: 27 mmol/L (ref 20–31)
Calcium: 9.5 mg/dL (ref 8.6–10.4)
Chloride: 103 mmol/L (ref 98–110)
Creat: 0.71 mg/dL (ref 0.50–1.05)
Glucose, Bld: 104 mg/dL — ABNORMAL HIGH (ref 65–99)
Potassium: 3.8 mmol/L (ref 3.5–5.3)
Sodium: 139 mmol/L (ref 135–146)
Total Bilirubin: 0.5 mg/dL (ref 0.2–1.2)
Total Protein: 7.1 g/dL (ref 6.1–8.1)

## 2015-05-08 LAB — LIPID PANEL
Cholesterol: 229 mg/dL — ABNORMAL HIGH (ref 125–200)
HDL: 60 mg/dL (ref >=46)
LDL Cholesterol: 133 mg/dL — ABNORMAL HIGH (ref <130)
Total CHOL/HDL Ratio: 3.8 Ratio (ref <=5.0)
Triglycerides: 179 mg/dL — ABNORMAL HIGH (ref <150)
VLDL: 36 mg/dL — ABNORMAL HIGH (ref <30)

## 2015-05-08 LAB — TSH: TSH: 1.29 mIU/L

## 2015-05-08 MED ORDER — AMPHETAMINE-DEXTROAMPHETAMINE 15 MG PO TABS: 15.0000 mg | ORAL_TABLET | Freq: Every day | ORAL | Status: DC

## 2015-05-08 NOTE — Patient Instructions (Signed)
     IF you received an x-ray today, you will receive an invoice from [facility]. Please contact [facility] at [phone] with questions or concerns regarding your invoice.   IF you received labwork today, you will receive an invoice from [facility]. Please contact [facility] at [phone] with questions or concerns regarding your invoice.   Our billing staff will not be able to assist you with questions regarding bills from these companies.  You will be contacted with the lab results as soon as they are available. The fastest way to get your results is to activate your My Chart account. Instructions are located on the last page of this paperwork. If you have not heard from us regarding the results in 2 weeks, please contact this office.      

## 2015-05-09 LAB — HEPATITIS C ANTIBODY: HCV Ab: NEGATIVE

## 2015-05-09 LAB — HIV ANTIBODY (ROUTINE TESTING W REFLEX): HIV 1&2 Ab, 4th Generation: NONREACTIVE

## 2015-05-10 NOTE — Progress Notes (Signed)
Subjective:    Patient ID: Anita Snow, female    DOB: 09/29/1960, 55 y.o.   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  Patient Active Problem List   Diagnosis Date Noted  . Generalized abdominal pain 01/20/2015  . Bloating 01/20/2015  . Nausea with vomiting 01/20/2015  . Diarrhea 01/20/2015  . Menopausal and perimenopausal disorder 2014--completed recent GYN evaluation with D&C, hysteroscopy, polyps and fibroids removed, progesterone patch.  11/23/2012  . ADD (attention deficit disorder)---His reducing the need for medication. The and training  08/05/2011  . AR (allergic rhinitis) 08/05/2011  . DDD (degenerative disc disease), cervical 08/05/2011  . Osteoarthritis, shoulder 08/05/2011  . Osteopenia 08/05/2011  . ESOPHAGEAL REFLUX 03/26/2009  . OSTEOARTHRITIS 03/26/2009   Health maintenance stable  Family history-mom had dementia and needed cardiac stents at age 30/father died of MI and had high cholesterol  Review of Systems 14 point review of systems otherwise noncontributory except she has less allergic problems and her typical cough has improved with albuterol inhaler when necessary she had discontinued Flonase because it caused dry Drive nasal passages. She has continued joint pain in her right great toe and maybe had surgery. Dr. Prudence Davidson. She follows with Dr. Edwin Cap for her thumb problem surgery. She has a increased irritability related to hormonal changes recent years but continues to be stressed by one signs substance abuse and delayed development.    Objective:   Physical Exam  Constitutional: She is oriented to person, place, and time. She appears well-developed and well-nourished. No distress.  HENT:  Head: Normocephalic.  Right Ear: External ear normal.    Left Ear: External ear normal.  Nose: Nose normal.  Mouth/Throat: Oropharynx is clear and moist.  Eyes: Conjunctivae and EOM are normal. Pupils are equal, round, and reactive to light.  Neck: Normal range of motion. Neck supple. No thyromegaly present.  Cardiovascular: Normal rate, regular rhythm, normal heart sounds and intact distal pulses.   No murmur heard. Pulmonary/Chest: Effort normal and breath sounds normal. She has no wheezes.  Abdominal: Soft. Bowel sounds are normal. She exhibits no distension and no mass. There is no tenderness. There is no rebound.  Musculoskeletal: Normal range of motion. She exhibits no edema or tenderness.  Lymphadenopathy:    She has no cervical adenopathy.  Neurological: She is alert and oriented to person, place, and time. She has normal reflexes. No cranial nerve deficit.  Skin: Skin is warm and dry. No rash noted.  Psychiatric: She has a normal mood and affect. Her behavior is normal. Judgment and thought content normal.  Nursing note and vitals reviewed. BP 134/82 mmHg  Pulse 70  Temp(Src) 97.9 F (36.6 C)  Resp 16  Ht 5' 4.5" (1.638 m)  Wt 161 lb (73.029 kg)  BMI 27.22 kg/m2  LMP 11/13/2012     Assessment & Plan:  Annual physical exam - Plan: Hepatitis C antibody, HIV antibody, Comprehensive metabolic panel, CBC with Differential/Platelet, Lipid panel, TSH   Attention deficit disorder    Meds ordered this encounter  Medications  . amphetamine-dextroamphetamine (ADDERALL) 15 MG tablet    Sig: Take 1 tablet by mouth daily.    Dispense:  60 tablet    Refill:  0    Addendum labs Results for orders placed or performed in visit on 05/08/15  Hepatitis  C antibody  Result Value Ref Range   HCV Ab NEGATIVE NEGATIVE  HIV antibody  Result Value Ref Range   HIV 1&2 Ab, 4th Generation NONREACTIVE NONREACTIVE  Comprehensive metabolic panel  Result Value Ref Range   Sodium 139 135 - 146 mmol/L   Potassium 3.8 3.5 - 5.3 mmol/L   Chloride  103 98 - 110 mmol/L   CO2 27 20 - 31 mmol/L   Glucose, Bld 104 (H) 65 - 99 mg/dL   BUN 9 7 - 25 mg/dL   Creat 0.71 0.50 - 1.05 mg/dL   Total Bilirubin 0.5 0.2 - 1.2 mg/dL   Alkaline Phosphatase 48 33 - 130 U/L   AST 16 10 - 35 U/L   ALT 17 6 - 29 U/L   Total Protein 7.1 6.1 - 8.1 g/dL   Albumin 4.6 3.6 - 5.1 g/dL   Calcium 9.5 8.6 - 10.4 mg/dL  CBC with Differential/Platelet  Result Value Ref Range   WBC 7.3 4.0 - 10.5 K/uL   RBC 4.50 3.87 - 5.11 MIL/uL   Hemoglobin 13.1 12.0 - 15.0 g/dL   HCT 39.1 36.0 - 46.0 %   MCV 86.9 78.0 - 100.0 fL   MCH 29.1 26.0 - 34.0 pg   MCHC 33.5 30.0 - 36.0 g/dL   RDW 13.3 11.5 - 15.5 %   Platelets 334 150 - 400 K/uL   MPV 10.9 8.6 - 12.4 fL   Neutrophils Relative % 60 43 - 77 %   Neutro Abs 4.4 1.7 - 7.7 K/uL   Lymphocytes Relative 29 12 - 46 %   Lymphs Abs 2.1 0.7 - 4.0 K/uL   Monocytes Relative 8 3 - 12 %   Monocytes Absolute 0.6 0.1 - 1.0 K/uL   Eosinophils Relative 2 0 - 5 %   Eosinophils Absolute 0.1 0.0 - 0.7 K/uL   Basophils Relative 1 0 - 1 %   Basophils Absolute 0.1 0.0 - 0.1 K/uL   Smear Review Criteria for review not met   Lipid panel  Result Value Ref Range   Cholesterol 229 (H) 125 - 200 mg/dL   Triglycerides 179 (H) <150 mg/dL   HDL 60 >=46 mg/dL   Total CHOL/HDL Ratio 3.8 <=5.0 Ratio   VLDL 36 (H) <30 mg/dL   LDL Cholesterol 133 (H) <130 mg/dL  TSH  Result Value Ref Range   TSH 1.29 mIU/L   No indication medications necessary//will continue to work with diet weight loss

## 2015-05-13 NOTE — Addendum Note (Signed)
Addended by: Wyatt Haste on: 05/13/2015 07:48 AM   Modules accepted: Miquel Dunn

## 2015-06-19 ENCOUNTER — Encounter: Payer: Self-pay | Admitting: Internal Medicine

## 2015-06-19 ENCOUNTER — Other Ambulatory Visit: Payer: Self-pay | Admitting: Internal Medicine

## 2015-06-19 MED ORDER — AMPHETAMINE-DEXTROAMPHETAMINE 15 MG PO TABS: 15.0000 mg | ORAL_TABLET | Freq: Every day | ORAL | Status: DC

## 2015-06-21 MED ORDER — AMPHETAMINE-DEXTROAMPHETAMINE 15 MG PO TABS: 15.0000 mg | ORAL_TABLET | Freq: Two times a day (BID) | ORAL | Status: DC

## 2015-06-21 MED ORDER — AMPHETAMINE-DEXTROAMPHETAMINE 15 MG PO TABS
15.0000 mg | ORAL_TABLET | Freq: Two times a day (BID) | ORAL | Status: DC
Start: 2015-06-21 — End: 2015-10-08

## 2015-06-21 NOTE — Telephone Encounter (Signed)
My error so meds reordered Meds ordered this encounter  Medications  . amphetamine-dextroamphetamine (ADDERALL) 15 MG tablet    Sig: Take 1 tablet by mouth 2 (two) times daily. For 30d after signed    Dispense:  60 tablet    Refill:  0  . amphetamine-dextroamphetamine (ADDERALL) 15 MG tablet    Sig: Take 1 tablet by mouth 2 (two) times daily. For 60d after signed    Dispense:  60 tablet    Refill:  0  . amphetamine-dextroamphetamine (ADDERALL) 15 MG tablet    Sig: Take 1 tablet by mouth 2 (two) times daily.    Dispense:  60 tablet    Refill:  0

## 2015-06-22 ENCOUNTER — Telehealth: Payer: Self-pay

## 2015-06-22 NOTE — Telephone Encounter (Signed)
Noting for patient record -  Patient brought in the prescription for

## 2015-06-22 NOTE — Telephone Encounter (Signed)
Accidentally closed out previous note.  Noting the following for patient record --  Patient brought in the old prescription for Adderall 15 MG tablet, 1 tablet by mouth daily.  Prescription was shredded.  Patient was given new rx for Adderall 15 MG tablet, 1 tablet by mouth 2 times a day.

## 2015-10-08 ENCOUNTER — Encounter (HOSPITAL_BASED_OUTPATIENT_CLINIC_OR_DEPARTMENT_OTHER): Payer: Self-pay | Admitting: *Deleted

## 2015-10-09 ENCOUNTER — Encounter (HOSPITAL_BASED_OUTPATIENT_CLINIC_OR_DEPARTMENT_OTHER): Payer: Self-pay | Admitting: *Deleted

## 2015-10-09 NOTE — Progress Notes (Addendum)
NPO AFTER MN WITH EXCEPTION CLEAR LIQUIDS UNTIL 0900 (NO CREAM [employer]).  ARRIVE AT 1300.  NEEDS HG. WILL TAKE NEXIUM AM DOS W/ SIPS OF WATER.   ADDENDUM:   CALLED AND LM FOR PT ON CELL PHONE THAT ABOUT TIME CHANGE FOR HER CASE.  NEW START TIME  IS 1300,  SHE NEEDS TO ARRIVE AT 37 AND CLEAR LIQUIDS UNTIL 0700.  ASKED THAT SHE CALLED BACK FOR CONFIRMATION OF MESSAGE.  RECEIVED PHONE MESSAGE THAT CONFIRMATION UNDERSTANDING TO ARRIVE AT 1115 AND ONLY CLEAR LIQUIDS UNTIL 0700.

## 2015-10-13 ENCOUNTER — Other Ambulatory Visit: Payer: Self-pay | Admitting: Orthopedic Surgery

## 2015-10-15 ENCOUNTER — Ambulatory Visit (HOSPITAL_BASED_OUTPATIENT_CLINIC_OR_DEPARTMENT_OTHER)
Admission: RE | Admit: 2015-10-15 | Discharge: 2015-10-15 | Disposition: A | Discharge status: Home / Self Care | Payer: Managed Care, Other (non HMO) | Source: Ambulatory Visit | Attending: Orthopedic Surgery | Admitting: Orthopedic Surgery

## 2015-10-15 ENCOUNTER — Ambulatory Visit (HOSPITAL_BASED_OUTPATIENT_CLINIC_OR_DEPARTMENT_OTHER): Payer: Managed Care, Other (non HMO) | Admitting: Anesthesiology

## 2015-10-15 ENCOUNTER — Encounter (HOSPITAL_BASED_OUTPATIENT_CLINIC_OR_DEPARTMENT_OTHER): Payer: Self-pay | Admitting: Anesthesiology

## 2015-10-15 ENCOUNTER — Encounter (HOSPITAL_BASED_OUTPATIENT_CLINIC_OR_DEPARTMENT_OTHER)
Admission: RE | Disposition: A | Discharge status: Home / Self Care | Payer: Self-pay | Source: Ambulatory Visit | Attending: Orthopedic Surgery

## 2015-10-15 DIAGNOSIS — K219 Gastro-esophageal reflux disease without esophagitis: Secondary | ICD-10-CM | POA: Insufficient documentation

## 2015-10-15 DIAGNOSIS — M659 Synovitis and tenosynovitis, unspecified: Secondary | ICD-10-CM | POA: Diagnosis not present

## 2015-10-15 DIAGNOSIS — M1990 Unspecified osteoarthritis, unspecified site: Secondary | ICD-10-CM | POA: Diagnosis not present

## 2015-10-15 DIAGNOSIS — R2232 Localized swelling, mass and lump, left upper limb: Secondary | ICD-10-CM | POA: Diagnosis not present

## 2015-10-15 DIAGNOSIS — M25742 Osteophyte, left hand: Secondary | ICD-10-CM | POA: Diagnosis not present

## 2015-10-15 HISTORY — DX: Presence of spectacles and contact lenses: Z97.3

## 2015-10-15 HISTORY — DX: Localized swelling, mass and lump, unspecified upper limb: R22.30

## 2015-10-15 HISTORY — DX: Elevated blood-pressure reading, without diagnosis of hypertension: R03.0

## 2015-10-15 HISTORY — DX: Pain in left finger(s): M79.645

## 2015-10-15 HISTORY — DX: Attention-deficit hyperactivity disorder, unspecified type: F90.9

## 2015-10-15 HISTORY — PX: MASS EXCISION: SHX2000

## 2015-10-15 HISTORY — DX: Other specified postprocedural states: Z98.890

## 2015-10-15 HISTORY — DX: Nausea with vomiting, unspecified: R11.2

## 2015-10-15 SURGERY — EXCISION MASS
Anesthesia: General | Site: Thumb | Laterality: Left

## 2015-10-15 MED ORDER — MIDAZOLAM HCL 2 MG/2ML IJ SOLN
INTRAMUSCULAR | Status: AC
  Filled 2015-10-15: qty 2

## 2015-10-15 MED ORDER — CHLORHEXIDINE GLUCONATE 4 % EX LIQD
60.0000 mL | Freq: Once | CUTANEOUS | Status: DC
  Filled 2015-10-15: qty 118

## 2015-10-15 MED ORDER — LIDOCAINE 2% (20 MG/ML) 5 ML SYRINGE
INTRAMUSCULAR | Status: DC | PRN
  Administered 2015-10-15: 100 mg via INTRAVENOUS

## 2015-10-15 MED ORDER — PROMETHAZINE HCL 25 MG/ML IJ SOLN
6.2500 mg | INTRAMUSCULAR | Status: DC | PRN
  Filled 2015-10-15: qty 1

## 2015-10-15 MED ORDER — OXYCODONE HCL 5 MG PO TABS: 5.0000 mg | ORAL_TABLET | ORAL | 0 refills | Status: DC | PRN

## 2015-10-15 MED ORDER — OXYCODONE HCL 5 MG PO TABS
ORAL_TABLET | ORAL | Status: AC
  Filled 2015-10-15: qty 1

## 2015-10-15 MED ORDER — DEXAMETHASONE SODIUM PHOSPHATE 4 MG/ML IJ SOLN
INTRAMUSCULAR | Status: DC | PRN
  Administered 2015-10-15: 10 mg via INTRAVENOUS

## 2015-10-15 MED ORDER — FENTANYL CITRATE (PF) 100 MCG/2ML IJ SOLN
INTRAMUSCULAR | Status: AC
  Filled 2015-10-15: qty 2

## 2015-10-15 MED ORDER — BUPIVACAINE HCL (PF) 0.25 % IJ SOLN
INTRAMUSCULAR | Status: DC | PRN
  Administered 2015-10-15: 10 mL

## 2015-10-15 MED ORDER — FENTANYL CITRATE (PF) 100 MCG/2ML IJ SOLN
25.0000 ug | INTRAMUSCULAR | Status: DC | PRN
  Administered 2015-10-15 (×2): 50 ug via INTRAVENOUS
  Filled 2015-10-15: qty 1

## 2015-10-15 MED ORDER — KETOROLAC TROMETHAMINE 30 MG/ML IJ SOLN
INTRAMUSCULAR | Status: DC | PRN
  Administered 2015-10-15: 30 mg via INTRAVENOUS

## 2015-10-15 MED ORDER — PROPOFOL 10 MG/ML IV BOLUS
INTRAVENOUS | Status: AC
  Filled 2015-10-15: qty 40

## 2015-10-15 MED ORDER — MEPERIDINE HCL 25 MG/ML IJ SOLN
6.2500 mg | INTRAMUSCULAR | Status: DC | PRN
  Filled 2015-10-15: qty 1

## 2015-10-15 MED ORDER — OXYCODONE HCL 5 MG PO TABS
5.0000 mg | ORAL_TABLET | Freq: Once | ORAL | Status: AC
  Administered 2015-10-15: 5 mg via ORAL
  Filled 2015-10-15: qty 1

## 2015-10-15 MED ORDER — DEXAMETHASONE SODIUM PHOSPHATE 10 MG/ML IJ SOLN
INTRAMUSCULAR | Status: AC
  Filled 2015-10-15: qty 1

## 2015-10-15 MED ORDER — VANCOMYCIN HCL IN DEXTROSE 1-5 GM/200ML-% IV SOLN
1000.0000 mg | INTRAVENOUS | Status: AC
  Administered 2015-10-15: 1000 mg via INTRAVENOUS
  Filled 2015-10-15 (×2): qty 200

## 2015-10-15 MED ORDER — MIDAZOLAM HCL 2 MG/2ML IJ SOLN
0.5000 mg | Freq: Once | INTRAMUSCULAR | Status: DC | PRN
  Filled 2015-10-15: qty 2

## 2015-10-15 MED ORDER — LIDOCAINE HCL (CARDIAC) 20 MG/ML IV SOLN
INTRAVENOUS | Status: AC
  Filled 2015-10-15: qty 5

## 2015-10-15 MED ORDER — ONDANSETRON HCL 4 MG/2ML IJ SOLN
INTRAMUSCULAR | Status: DC | PRN
Start: 2015-10-15 — End: 2015-10-15
  Administered 2015-10-15: 4 mg via INTRAVENOUS

## 2015-10-15 MED ORDER — MIDAZOLAM HCL 5 MG/5ML IJ SOLN
INTRAMUSCULAR | Status: DC | PRN
  Administered 2015-10-15: 2 mg via INTRAVENOUS

## 2015-10-15 MED ORDER — LACTATED RINGERS IV SOLN
INTRAVENOUS | Status: DC
  Administered 2015-10-15: 12:00:00 via INTRAVENOUS
  Filled 2015-10-15: qty 1000

## 2015-10-15 MED ORDER — KETOROLAC TROMETHAMINE 30 MG/ML IJ SOLN
INTRAMUSCULAR | Status: AC
  Filled 2015-10-15: qty 1

## 2015-10-15 MED ORDER — PROPOFOL 10 MG/ML IV BOLUS
INTRAVENOUS | Status: DC | PRN
  Administered 2015-10-15: 200 mg via INTRAVENOUS

## 2015-10-15 MED ORDER — FENTANYL CITRATE (PF) 100 MCG/2ML IJ SOLN
INTRAMUSCULAR | Status: DC | PRN
  Administered 2015-10-15: 50 ug via INTRAVENOUS

## 2015-10-15 MED FILL — oxyCODONE HCL 5 MG TABS: 5 | 5 days supply | Qty: 30 | Fill #0

## 2015-10-15 SURGICAL SUPPLY — 43 items
BANDAGE ACE 3X5.8 VEL STRL LF (GAUZE/BANDAGES/DRESSINGS) ×1 IMPLANT
BANDAGE COBAN STERILE 2 (GAUZE/BANDAGES/DRESSINGS) IMPLANT
BLADE SURG 15 STRL LF DISP TIS (BLADE) ×2 IMPLANT
BLADE SURG 15 STRL SS (BLADE) ×4
BNDG COHESIVE 1X5 TAN STRL LF (GAUZE/BANDAGES/DRESSINGS) ×1 IMPLANT
BNDG CONFORM 3 STRL LF (GAUZE/BANDAGES/DRESSINGS) ×2 IMPLANT
BRUSH SCRUB EZ PLAIN DRY (MISCELLANEOUS) ×2 IMPLANT
CORDS BIPOLAR (ELECTRODE) ×2 IMPLANT
COVER BACK TABLE 60X90IN (DRAPES) ×2 IMPLANT
CUFF TOURNIQUET SINGLE 18IN (TOURNIQUET CUFF) ×1 IMPLANT
DRAPE EXTREMITY T 121X128X90 (DRAPE) ×2 IMPLANT
DRAPE OEC MINIVIEW 54X84 (DRAPES) ×1 IMPLANT
DRAPE SURG 17X23 STRL (DRAPES) ×2 IMPLANT
DRSG EMULSION OIL 3X3 NADH (GAUZE/BANDAGES/DRESSINGS) ×2 IMPLANT
GAUZE SPONGE 4X4 12PLY STRL (GAUZE/BANDAGES/DRESSINGS) ×1 IMPLANT
GAUZE XEROFORM 1X8 LF (GAUZE/BANDAGES/DRESSINGS) ×1 IMPLANT
GLOVE BIOGEL M STRL SZ7.5 (GLOVE) IMPLANT
GLOVE SS BIOGEL STRL SZ 8 (GLOVE) ×1 IMPLANT
GLOVE SUPERSENSE BIOGEL SZ 8 (GLOVE) ×1
GOWN STRL REUS W/ TWL LRG LVL3 (GOWN DISPOSABLE) ×1 IMPLANT
GOWN STRL REUS W/ TWL XL LVL3 (GOWN DISPOSABLE) ×1 IMPLANT
GOWN STRL REUS W/TWL LRG LVL3 (GOWN DISPOSABLE) ×2
GOWN STRL REUS W/TWL XL LVL3 (GOWN DISPOSABLE) ×2
NEEDLE HYPO 22GX1.5 SAFETY (NEEDLE) ×1 IMPLANT
NS IRRIG 1000ML POUR BTL (IV SOLUTION) ×2 IMPLANT
PACK BASIN DAY SURGERY FS (CUSTOM PROCEDURE TRAY) ×2 IMPLANT
PADDING UNDERCAST 2 STRL (CAST SUPPLIES) ×3
PADDING UNDERCAST 2X4 STRL (CAST SUPPLIES) IMPLANT
SHEET MEDIUM DRAPE 40X70 STRL (DRAPES) ×3 IMPLANT
SLEEVE SURGEON STRL (DRAPES) ×1 IMPLANT
STOCKINETTE 4X48 STRL (DRAPES) ×2 IMPLANT
STOCKINETTE SYNTHETIC 4 NONSTR (MISCELLANEOUS) ×1 IMPLANT
STRIP CLOSURE SKIN 1/2X4 (GAUZE/BANDAGES/DRESSINGS) IMPLANT
SUT BONE WAX W31G (SUTURE) ×1 IMPLANT
SUT FIBERWIRE 4-0 18 TAPR NDL (SUTURE) ×2
SUT PROLENE 4 0 PS 2 18 (SUTURE) ×2 IMPLANT
SUT PROLENE 5 0 P 3 (SUTURE) IMPLANT
SUTURE FIBERWR 4-0 18 TAPR NDL (SUTURE) IMPLANT
SYR BULB 3OZ (MISCELLANEOUS) ×2 IMPLANT
SYR CONTROL 10ML LL (SYRINGE) ×2 IMPLANT
TOWEL OR 17X24 6PK STRL BLUE (TOWEL DISPOSABLE) ×2 IMPLANT
TOWEL OR NON WOVEN STRL DISP B (DISPOSABLE) ×3 IMPLANT
UNDERPAD 30X30 (UNDERPADS AND DIAPERS) ×2 IMPLANT

## 2015-10-15 NOTE — Discharge Instructions (Signed)
.  Keep bandage clean and dry.  Call for any problems.  No smoking.  Criteria for driving a car: you should be off your pain medicine for 7-8 hours, able to drive one handed(confident), thinking clearly and feeling able in your judgement to drive. Continue elevation as it will decrease swelling.  If instructed by MD move your fingers within the confines of the bandage/splint.  Use ice if instructed by your MD. Call immediately for any sudden loss of feeling in your hand/arm or change in functional abilities of the extremity. Keep bandage clean and dry.  Call for any problems.  No smoking.  Criteria for driving a car: you should be off your pain medicine for 7-8 hours, able to drive one handed(confident), thinking clearly and feeling able in your judgement to drive. Continue elevation as it will decrease swelling.  If instructed by MD move your fingers within the confines of the bandage/splint.  Use ice if instructed by your MD. Call immediately for any sudden loss of feeling in your hand/arm or change in functional abilities of the extremity.  Post Anesthesia Home Care Instructions  Activity: Get plenty of rest for the remainder of the day. A responsible adult should stay with you for 24 hours following the procedure.  For the next 24 hours, DO NOT: -Drive a car -Paediatric nurse -Drink alcoholic beverages -Take any medication unless instructed by your physician -Make any legal decisions or sign important papers.  Meals: Start with liquid foods such as gelatin or soup. Progress to regular foods as tolerated. Avoid greasy, spicy, heavy foods. If nausea and/or vomiting occur, drink only clear liquids until the nausea and/or vomiting subsides. Call your physician if vomiting continues.  Special Instructions/Symptoms: Your throat may feel dry or sore from the anesthesia or the breathing tube placed in your throat during surgery. If this causes discomfort, gargle with warm salt water. The discomfort  should disappear within 24 hours.  If you had a scopolamine patch placed behind your ear for the management of post- operative nausea and/or vomiting:  1. The medication in the patch is effective for 72 hours, after which it should be removed.  Wrap patch in a tissue and discard in the trash. Wash hands thoroughly with soap and water. 2. You may remove the patch earlier than 72 hours if you experience unpleasant side effects which may include dry mouth, dizziness or visual disturbances. 3. Avoid touching the patch. Wash your hands with soap and water after contact with the patch.

## 2015-10-15 NOTE — Anesthesia Preprocedure Evaluation (Addendum)
Anesthesia Evaluation  Patient identified by MRN, date of birth, ID band Patient awake    History of Anesthesia Complications Negative for: history of anesthetic complications  Airway Mallampati: II  TM Distance: >3 FB Neck ROM: Full    Dental  (+) Dental Advisory Given   Pulmonary neg pulmonary ROS,    breath sounds clear to auscultation       Cardiovascular negative cardio ROS   Rhythm:Regular Rate:Normal     Neuro/Psych PSYCHIATRIC DISORDERS (ADHD) negative neurological ROS     GI/Hepatic Neg liver ROS, GERD  Medicated and Controlled,  Endo/Other  negative endocrine ROS  Renal/GU negative Renal ROS     Musculoskeletal  (+) Arthritis , Osteoarthritis,    Abdominal   Peds  Hematology negative hematology ROS (+)   Anesthesia Other Findings   Reproductive/Obstetrics                            Anesthesia Physical Anesthesia Plan  ASA: II  Anesthesia Plan: General   Post-op Pain Management:    Induction: Intravenous  Airway Management Planned: LMA  Additional Equipment:   Intra-op Plan:   Post-operative Plan:   Informed Consent: I have reviewed the patients History and Physical, chart, labs and discussed the procedure including the risks, benefits and alternatives for the proposed anesthesia with the patient or authorized representative who has indicated his/her understanding and acceptance.   Dental advisory given  Plan Discussed with: CRNA and Surgeon  Anesthesia Plan Comments: (Plan routine monitors, GA- LMA OK Pt declines ax block)        Anesthesia Quick Evaluation

## 2015-10-15 NOTE — Transfer of Care (Signed)
Immediate Anesthesia Transfer of Care Note  Patient: Anita Snow  Procedure(s) Performed: Procedure(s): LEFT THUMB EXCISION MASS WITH ARTHROTOMY AND SYNOVECTOMY (Left)  Patient Location: PACU  Anesthesia Type:General  Level of Consciousness: awake, alert  and oriented  Airway & Oxygen Therapy: Patient Spontanous Breathing and Patient connected to nasal cannula oxygen  Post-op Assessment: Report given to RN  Post vital signs: Reviewed and stable  Last Vitals: 133/95, 74, 12, 100%, 97.4 Vitals:   10/15/15 1128  BP: (!) 142/91  Pulse: 81  Resp: 16  Temp: 37.2 C    Last Pain:  Vitals:   10/15/15 1128  TempSrc: Oral      Patients Stated Pain Goal: 4 ([id number])  Complications: No apparent anesthesia complications

## 2015-10-15 NOTE — Anesthesia Postprocedure Evaluation (Signed)
Anesthesia Post Note  Patient: Anita Snow  Procedure(s) Performed: Procedure(s) (LRB): LEFT THUMB EXCISION MASS WITH ARTHROTOMY AND SYNOVECTOMY (Left)  Patient location during evaluation: PACU Anesthesia Type: General Level of consciousness: awake and alert Pain management: pain level controlled Vital Signs Assessment: post-procedure vital signs reviewed and stable Respiratory status: spontaneous breathing, nonlabored ventilation, respiratory function stable and patient connected to nasal cannula oxygen Cardiovascular status: blood pressure returned to baseline and stable Postop Assessment: no signs of nausea or vomiting Anesthetic complications: no    Last Vitals:  Vitals:   10/15/15 1441 10/15/15 1449  BP:  (!) 145/93  Pulse: (!) 59 64  Resp: 10 14  Temp:      Last Pain:  Vitals:   10/15/15 1449  TempSrc:   PainSc: 5                  Zenaida Deed

## 2015-10-15 NOTE — H&P (Signed)
Anita Snow is an 55 y.o. female.   Chief Complaint: left dorsal thumb mass HPI: the patient is a pleasant 55 year old female who presents with a painful left dorsal thumb mass. The patient desires surgical excision. We have seen and examined the patient and discussed with her the nature upper extremity predicament at length in our office setting. All questions were encouraged and answered.   Past Medical History:  Diagnosis Date  . ADHD (attention deficit hyperactivity disorder)   . Arthritis   . GERD (gastroesophageal reflux disease)   . Hiatal hernia   . Mass of finger    LEFT THUMB  . Pain of left thumb   . PONV (postoperative nausea and vomiting)   . Wears glasses   . White coat syndrome without hypertension     Past Surgical History:  Procedure Laterality Date  . [place]  . D & C HYSTECSCOPY W/ RESECTION POLYP  04/2015  . KNEE ARTHROSCOPY Right 2010  . SHOULDER ARTHROSCOPY Left 2007   buritis  . TONSILLECTOMY AND ADENOIDECTOMY  as child    Family History  Problem Relation Age of Onset  . Heart disease Father   . Arthritis Mother   . Cancer Maternal Grandmother     lung  . COPD Maternal Grandfather     emphysema  . Heart disease Paternal Grandfather   . Colon cancer Neg Hx    Social History:  reports that she has never smoked. She has never used smokeless tobacco. She reports that she drinks alcohol. She reports that she does not use drugs.  Allergies:  Allergies  Allergen Reactions  . Cephalosporins Swelling  . Shellfish Allergy Swelling    Lip swells and hands swell  8 to 10 hours after being around it    Medications Prior to Admission  Medication Sig Dispense Refill  . amphetamine-dextroamphetamine (ADDERALL) 20 MG tablet Take 20 mg by mouth every morning.    . cycloSPORINE (RESTASIS) 0.05 % ophthalmic emulsion Place 1 drop into both eyes 2 (two) times daily.    Marland Kitchen esomeprazole (NEXIUM) 20 MG capsule TAKE 1 CAPSULE DAILY (Patient  taking differently: Take 20 mg by mouth every morning. TAKE 1 CAPSULE DAILY) 90 capsule 3  . ibuprofen (ADVIL,MOTRIN) 200 MG tablet Take 200 mg by mouth every 6 (six) hours as needed.    Marland Kitchen MINIVELLE 0.05 MG/24HR patch Place 1 patch onto the skin 2 (two) times a week.   3  . progesterone (PROMETRIUM) 100 MG capsule Take 100 mg by mouth every evening.     Marland Kitchen albuterol (PROVENTIL HFA;VENTOLIN HFA) 108 (90 BASE) MCG/ACT inhaler Inhale 2 puffs into the lungs every 6 (six) hours as needed for wheezing. 1 Inhaler 0  . fluticasone (FLONASE) 50 MCG/ACT nasal spray Place 2 sprays into the nose daily. (Patient taking differently: Place 2 sprays into the nose daily as needed. ) 16 g 5  . saccharomyces boulardii (FLORASTOR) 250 MG capsule Take 1 capsule (250 mg total) by mouth 2 (two) times daily. (Patient taking differently: Take 250 mg by mouth 2 (two) times daily as needed. ) 60 capsule 1    No results found for this or any previous visit (from the past 48 hour(s)). No results found.  Review of Systems  Constitutional: Negative.   HENT: Negative.   Eyes: Negative.   Cardiovascular: Negative.   Musculoskeletal:       See history of the present illness  Skin: Negative.  Blood pressure (!) 142/91, pulse 81, temperature 98.9 F (37.2 C), temperature source Oral, resp. rate 16, height 5\' 5"  (1.651 m), weight 73 kg (161 lb), last menstrual period 11/13/2012, SpO2 100 %. Physical Exam  The patient is alert and oriented in no acute distress. The patient complains of pain in the affected upper extremity.  The patient is noted to have a normal HEENT exam. Lung fields show equal chest expansion and no shortness of breath. Abdomen exam is nontender without distention. Lower extremity examination does not show any fracture dislocation or blood clot symptoms. Pelvis is stable and the neck and back are stable and nontender. Examination of the left dorsal thumb shows a firm painful nodule without signs of  infection or cellulitis Assessment/Plan Left dorsal thumb mass Patient Active Problem List   Diagnosis Date Noted  . Generalized abdominal pain 01/20/2015  . Bloating 01/20/2015  . Nausea with vomiting 01/20/2015  . Diarrhea 01/20/2015  . Menopausal and perimenopausal disorder 2014 11/23/2012  . ADD (attention deficit disorder) 08/05/2011  . AR (allergic rhinitis) 08/05/2011  . DDD (degenerative disc disease), cervical 08/05/2011  . Osteoarthritis, shoulder 08/05/2011  . Osteopenia 08/05/2011  . ESOPHAGEAL REFLUX 03/26/2009  . OSTEOARTHRITIS 03/26/2009  We are planning surgery for your upper extremity. The risk and benefits of surgery to include risk of bleeding, infection, anesthesia,  damage to normal structures and failure of the surgery to accomplish its intended goals of relieving symptoms and restoring function have been discussed in detail. With this in mind we plan to proceed. I have specifically discussed with the patient the pre-and postoperative regime and the dos and don'ts and risk and benefits in great detail. Risk and benefits of surgery also include risk of dystrophy(CRPS), chronic nerve pain, failure of the healing process to go onto completion and other inherent risks of surgery The relavent the pathophysiology of the disease/injury process, as well as the alternatives for treatment and postoperative course of action has been discussed in great detail with the patient who desires to proceed.  We will do everything in our power to help you (the patient) restore function to the upper extremity. It is a pleasure to see this patient today.   Irwin Toran L, PA-C 10/15/2015, 12:42 PM

## 2015-10-15 NOTE — Op Note (Signed)
dictation number [id number]  Status post arthrotomy /synovectomy, mass removal and bony debridement left thumb  Anita Snow M.D.

## 2015-10-16 ENCOUNTER — Encounter (HOSPITAL_BASED_OUTPATIENT_CLINIC_OR_DEPARTMENT_OTHER): Payer: Self-pay | Admitting: Orthopedic Surgery

## 2015-10-16 NOTE — Op Note (Signed)
Anita Snow, Anita Snow                ACCOUNT NO.:  [id number]  MEDICAL RECORD NO.:  [id number]  LOCATION:                                 FACILITY:  PHYSICIAN:  Satira Anis. Stelios Kirby, M.D.DATE OF BIRTH:  02/21/1960  DATE OF PROCEDURE: DATE OF DISCHARGE:                              OPERATIVE REPORT   PREOPERATIVE DIAGNOSES: 1. Left thumb mass. 2. Left thumb dorsal impingement with osteophyte and degenerative     process. 3. Synovitis, left thumb interphalangeal joint.  POSTOPERATIVE DIAGNOSES: 1. Left thumb mass. 2. Left thumb dorsal impingement with osteophyte and degenerative     process. 3. Synovitis, left thumb interphalangeal joint.  PROCEDURES: 1. Excision of less than 1.5-cm deep mass, left thumb dorsal aspect. 2. Arthrotomy and synovectomy, left thumb, interphalangeal joint. 3. Osteophyte debridement with removal of proximal and distal phalanx     spurs/bony impingement process, left thumb. 4. AP, lateral and oblique radiographs performed, examined and     interpreted by myself.  SURGEON:  Satira Anis. Amedeo Plenty, M.D.  ASSISTANT:  Avelina Laine, P.A.-C.  COMPLICATION:  None.  ANESTHESIA:  General.  TOURNIQUET TIME:  Less than an hour.  INDICATIONS FOR THE PROCEDURE:  This patient is a pleasant 55 year old female, presents with the above-mentioned diagnosis.  I have counseled [name] in regard to risks and benefits of surgery and she desires to proceed.  All questions have been encouraged and answered preoperatively.  OPERATIVE PROCEDURE:  The patient was seen by myself and Anesthesia, taken to the operative theater and underwent a smooth induction of general anesthetic.  She was prepped and draped with the Hibiclens scrub followed by time-out being observed.  Pre and postop checklist complete. Preoperative vancomycin was given and the patient then underwent a modified curvilinear incision about the dorsal aspect of the thumb, dissection was carried down and a less  than 1.5-cm deep mass was excised.  This was encroached in into the eponychial fold.  I removed in its entirety and traced the stalk origin back to the IP joint.  This was debrided and removed.  Following this, I made a split in the EPL, folded the tendon back keeping investments intact about the distal phalanx.  I then very gently and cautiously performed arthrotomy, synovectomy and loose body removal.  Following this, I then performed major debridement of bone about the distal phalanx and proximal phalanx.  I removed the spurs and following spur removal, then performed placement of bone wax.  Tourniquet was deflated.  Extensor was closed with a running FiberWire stitch.  There were no complicating features.  Once this was complete, we then performed irrigation followed by wound closure.  Splint was applied.  No complications noted.  All sponge, needle and instrument counts were reported as correct.  The patient will be monitored in the recovery room and discharged home after period of observatory care on pain meds and according to our protocol with RTC in 12-14 days.     Satira Anis. Amedeo Plenty, M.D.   ______________________________ Satira Anis. Amedeo Plenty, M.D.    [facility]  D:  10/15/2015  T:  10/16/2015  Job:  [id number]

## 2015-10-17 LAB — POCT HEMOGLOBIN-HEMACUE: Hemoglobin: 12.2 g/dL (ref 12.0–15.0)

## 2015-11-07 ENCOUNTER — Other Ambulatory Visit: Payer: Self-pay

## 2015-11-07 NOTE — Telephone Encounter (Signed)
Msg is for Anita Snow, Refill on Adderall at least until her appointment on 9/26.  Please advise   [phone]

## 2015-11-08 MED ORDER — AMPHETAMINE-DEXTROAMPHETAMINE 20 MG PO TABS: 20.0000 mg | ORAL_TABLET | Freq: Every morning | ORAL | 0 refills | Status: DC

## 2015-11-08 NOTE — Telephone Encounter (Signed)
Meds ordered this encounter  Medications  . amphetamine-dextroamphetamine (ADDERALL) 20 MG tablet    Sig: Take 1 tablet (20 mg total) by mouth every morning.    Dispense:  30 tablet    Refill:  0

## 2015-11-11 NOTE — Telephone Encounter (Signed)
Notified pt ready on VM

## 2015-11-12 ENCOUNTER — Ambulatory Visit: Payer: Managed Care, Other (non HMO) | Admitting: Physician Assistant

## 2015-11-12 ENCOUNTER — Encounter: Payer: Self-pay | Admitting: Physician Assistant

## 2015-11-12 ENCOUNTER — Ambulatory Visit (INDEPENDENT_AMBULATORY_CARE_PROVIDER_SITE_OTHER): Payer: Managed Care, Other (non HMO) | Admitting: Physician Assistant

## 2015-11-12 VITALS — BP 132/86 | HR 86 | Temp 99.0°F | Resp 16 | Wt 158.0 lb

## 2015-11-12 DIAGNOSIS — F909 Attention-deficit hyperactivity disorder, unspecified type: Secondary | ICD-10-CM

## 2015-11-12 DIAGNOSIS — Z23 Encounter for immunization: Secondary | ICD-10-CM | POA: Diagnosis not present

## 2015-11-12 DIAGNOSIS — F988 Other specified behavioral and emotional disorders with onset usually occurring in childhood and adolescence: Secondary | ICD-10-CM

## 2015-11-12 DIAGNOSIS — E785 Hyperlipidemia, unspecified: Secondary | ICD-10-CM | POA: Diagnosis not present

## 2015-11-12 MED ORDER — AMPHETAMINE-DEXTROAMPHET ER 20 MG PO CP24: 20.0000 mg | ORAL_CAPSULE | Freq: Every day | ORAL | 0 refills | Status: DC

## 2015-11-12 NOTE — Progress Notes (Signed)
Patient ID: Anita Snow, female    DOB: Nov 11, 1960, 55 y.o.   MRN: [id number]  PCP: No primary care provider on file. Previously managed by Dr. Laney Pastor, who has retired.  Subjective:   Chief Complaint  Patient presents with  . Medication Check    adderall    HPI Presents for medication refill and to establish for new PCP.  Took Vyvanse for years, and it worked well, but she had palpitations, and so changed to Adderall.  20 mg seemed ok for the morning, but seemed too strong for the afternoon dose. Feels productive in the mornings, feels good. Wears off in the afternoons, but since she finishes work at 2 pm, she wants to stop taking the afternoon dose. She'll plan to try using meditation and yoga, but is interested in trying the extended release Adderall, to see if she can tolerate it better than the Vyvanse, and get a longer effect..  Has started Weight Wat[employer]. 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   Review of Systems No chest pain, SOB, HA, dizziness, vision change, N/V, diarrhea, constipation, dysuria, urinary urgency or frequency, or rash. Recovering from recent surgery on the LEFT thumb-has some stiffness and tenderness.     Patient Active Problem List   Diagnosis Date Noted  . Generalized abdominal pain 01/20/2015  . Bloating 01/20/2015  . Nausea with vomiting 01/20/2015  . Diarrhea 01/20/2015  . Menopausal and perimenopausal disorder 2014 11/23/2012  . ADD (attention deficit disorder) 08/05/2011  . AR (allergic rhinitis) 08/05/2011  . DDD (degenerative disc disease), cervical 08/05/2011  . Osteoarthritis, shoulder 08/05/2011  . Osteopenia 08/05/2011  . ESOPHAGEAL REFLUX 03/26/2009  . OSTEOARTHRITIS 03/26/2009     Prior to Admission medications   Medication Sig Start Date End Date Taking? Authorizing Provider  albuterol (PROVENTIL HFA;VENTOLIN HFA) 108 (90 BASE) MCG/ACT inhaler Inhale 2 puffs into the lungs every 6 (six)  hours as needed for wheezing. 11/24/12  Yes Leandrew Koyanagi, MD  amphetamine-dextroamphetamine (ADDERALL) 20 MG tablet Take 1 tablet (20 mg total) by mouth every morning. 11/08/15  Yes Carmelo Reidel, PA-C  cycloSPORINE (RESTASIS) 0.05 % ophthalmic emulsion Place 1 drop into both eyes 2 (two) times daily.   Yes Historical Provider, MD  esomeprazole (NEXIUM) 20 MG capsule TAKE 1 CAPSULE DAILY Patient taking differently: Take 20 mg by mouth every morning. TAKE 1 CAPSULE DAILY 04/27/12  Yes Leandrew Koyanagi, MD  fluticasone [facility]) 50 MCG/ACT nasal spray Place 2 sprays into the nose daily. Patient taking differently: Place 2 sprays into the nose daily as needed.  11/23/12  Yes Leandrew Koyanagi, MD  MINIVELLE 0.05 MG/24HR patch Place 1 patch onto the skin 2 (two) times a week.  01/28/14  Yes Historical Provider, MD  progesterone (PROMETRIUM) 100 MG capsule Take 100 mg by mouth every evening.    Yes Historical Provider, MD  saccharomyces boulardii (FLORASTOR) 250 MG capsule Take 1 capsule (250 mg total) by mouth 2 (two) times daily. Patient taking differently: Take 250 mg by mouth 2 (two) times daily as needed.  01/04/15  Yes Laban Emperor Zehr, PA-C     Allergies  Allergen Reactions  . Cephalosporins Swelling  . Shellfish Allergy Swelling    Lip swells and hands swell  8 to 10 hours after being around it       Objective:  Physical Exam  Constitutional: She is oriented to person, place, and time. She appears well-developed and well-nourished. She is active and  cooperative. No distress.  BP 132/86 (BP Location: Left Arm, Patient Position: Sitting, Cuff Size: Normal)   Pulse 86   Temp 99 F (37.2 C)   Resp 16   Wt 158 lb (71.7 kg)   LMP 11/13/2012   SpO2 97%   BMI 26.29 kg/m   HENT:  Head: Normocephalic and atraumatic.  Right Ear: Hearing normal.  Left Ear: Hearing normal.  Eyes: Conjunctivae are normal. No scleral icterus.  Neck: Normal range of motion. Neck supple. No thyromegaly  present.  Cardiovascular: Normal rate, regular rhythm and normal heart sounds.   Pulses:      Radial pulses are 2+ on the right side, and 2+ on the left side.  Pulmonary/Chest: Effort normal and breath sounds normal.  Lymphadenopathy:       Head (right side): No tonsillar, no preauricular, no posterior auricular and no occipital adenopathy present.       Head (left side): No tonsillar, no preauricular, no posterior auricular and no occipital adenopathy present.    She has no cervical adenopathy.       Right: No supraclavicular adenopathy present.       Left: No supraclavicular adenopathy present.  Neurological: She is alert and oriented to person, place, and time. No sensory deficit.  Skin: Skin is warm, dry and intact. No rash noted. No cyanosis or erythema. Nails show no clubbing.  Psychiatric: She has a normal mood and affect. Her speech is normal and behavior is normal.           Assessment & Plan:   1. Hyperlipidemia Not fasting today. Continue healthy lifestyle changes and plan fasting labs at her next visit.  2. ADD (attention deficit disorder) Trial of Adderall XR 20 mg. She will let me know whether she prefers this or the immediate release going forward. Plan to fill by phone/My Chart x 6 months, then re-evaluate in the office. - amphetamine-dextroamphetamine (ADDERALL XR) 20 MG 24 hr capsule; Take 1 capsule (20 mg total) by mouth daily.  Dispense: 30 capsule; Refill: 0  3. Need for influenza vaccination - Flu Vaccine QUAD 36+ mos IM   Fara Chute, PA-C Physician Assistant-Certified [facility]

## 2015-11-12 NOTE — Patient Instructions (Signed)
     IF you received an x-ray today, you will receive an invoice from [facility]. Please contact [facility] at [phone] with questions or concerns regarding your invoice.   IF you received labwork today, you will receive an invoice from [facility]. Please contact [facility] at [phone] with questions or concerns regarding your invoice.   Our billing staff will not be able to assist you with questions regarding bills from these companies.  You will be contacted with the lab results as soon as they are available. The fastest way to get your results is to activate your My Chart account. Instructions are located on the last page of this paperwork. If you have not heard from us regarding the results in 2 weeks, please contact this office.      

## 2016-01-13 ENCOUNTER — Other Ambulatory Visit: Payer: Self-pay

## 2016-01-13 NOTE — Telephone Encounter (Signed)
Pt is needing a refill on adderall 20mg  extended release   Best number [phone]

## 2016-01-16 NOTE — Telephone Encounter (Signed)
2017 last ov and refills

## 2016-01-16 NOTE — Addendum Note (Signed)
Addended by: Virgia Land on: 01/16/2016 12:33 PM   Modules accepted: Orders

## 2016-01-16 NOTE — Telephone Encounter (Signed)
PT is calling again in regards to her adderall 20 mg extended release    Please advise [phone]

## 2016-01-18 ENCOUNTER — Telehealth: Payer: Self-pay

## 2016-01-18 NOTE — Telephone Encounter (Signed)
CHELLE - PT called again about adderall refill.  I told her Chelle was sent the request and she would be back on Monday. [phone]

## 2016-01-18 NOTE — Telephone Encounter (Signed)
Thank you, the message has been routed to [place],

## 2016-01-20 ENCOUNTER — Telehealth: Payer: Self-pay

## 2016-01-20 MED ORDER — AMPHETAMINE-DEXTROAMPHET ER 20 MG PO CP24: 20.0000 mg | ORAL_CAPSULE | Freq: Every day | ORAL | 0 refills | Status: DC

## 2016-01-20 NOTE — Telephone Encounter (Signed)
LMVM that patient's RX is ready for pickup at [address] front desk.

## 2016-01-20 NOTE — Telephone Encounter (Signed)
Meds ordered this encounter  Medications  . amphetamine-dextroamphetamine (ADDERALL XR) 20 MG 24 hr capsule    Sig: Take 1 capsule (20 mg total) by mouth daily.    Dispense:  30 capsule    Refill:  0

## 2016-01-20 NOTE — Telephone Encounter (Signed)
Up front pt advised

## 2016-01-20 NOTE — Addendum Note (Signed)
Addended by: Fara Chute on: 01/20/2016 08:32 AM   Modules accepted: Orders

## 2016-02-15 ENCOUNTER — Telehealth: Payer: Self-pay

## 2016-02-15 NOTE — Telephone Encounter (Signed)
PATIENT WOULD LIKE Anita Snow TO KNOW THAT SHE NEEDS A REFILL ON ADDERALL XR 20 MG. PLEASE CALL HER WHEN SHE CAN PICK THE PRESCRIPTION UP. IF SHE DOES NOT ANSWER [place] LEAVE HER A MESSAGE. BEST PHONE [phone] (CELL)  [place]

## 2016-02-18 NOTE — Telephone Encounter (Signed)
See previous note

## 2016-02-21 MED ORDER — AMPHETAMINE-DEXTROAMPHET ER 20 MG PO CP24: 20.0000 mg | ORAL_CAPSULE | Freq: Every day | ORAL | 0 refills | Status: DC

## 2016-02-21 NOTE — Telephone Encounter (Signed)
Meds ordered this encounter  Medications  . amphetamine-dextroamphetamine (ADDERALL XR) 20 MG 24 hr capsule    Sig: Take 1 capsule (20 mg total) by mouth daily.    Dispense:  30 capsule    Refill:  0    Order Specific Question:   Supervising Provider    Answer:   Brigitte Pulse, EVA N [[id number]]

## 2016-03-21 ENCOUNTER — Other Ambulatory Visit: Payer: Self-pay

## 2016-03-21 NOTE — Telephone Encounter (Signed)
Pt would like a refill on her amphetamine-dextroamphetamine (ADDERALL XR) 20 MG 24 hr capsule. CB [phone]

## 2016-03-23 MED ORDER — AMPHETAMINE-DEXTROAMPHET ER 20 MG PO CP24: 20.0000 mg | ORAL_CAPSULE | Freq: Every day | ORAL | 0 refills | Status: DC

## 2016-03-23 NOTE — Telephone Encounter (Signed)
Meds ordered this encounter  Medications  . amphetamine-dextroamphetamine (ADDERALL XR) 20 MG 24 hr capsule    Sig: Take 1 capsule (20 mg total) by mouth daily.    Dispense:  30 capsule    Refill:  0    Order Specific Question:   Supervising Provider    Answer:   Brigitte Pulse, EVA N [[id number]]    Please remind patient that she is due for a follow-up with me in March.

## 2016-03-23 NOTE — Telephone Encounter (Signed)
Patient informed. 

## 2016-04-24 ENCOUNTER — Telehealth: Payer: Self-pay | Admitting: Physician Assistant

## 2016-04-24 NOTE — Telephone Encounter (Signed)
Pt is calling for a refill on her adderall   Best number [phone]

## 2016-04-24 NOTE — Telephone Encounter (Signed)
03/23/16 last refill 10/2015 last ov

## 2016-04-27 MED ORDER — AMPHETAMINE-DEXTROAMPHET ER 20 MG PO CP24: 20.0000 mg | ORAL_CAPSULE | Freq: Every day | ORAL | 0 refills | Status: DC

## 2016-04-27 NOTE — Telephone Encounter (Signed)
Pt advised, has appt 05/12/16

## 2016-04-27 NOTE — Telephone Encounter (Signed)
Done. She will need an OV next month.  Can we please call and schedule it.

## 2016-04-27 NOTE — Telephone Encounter (Signed)
rx up front 

## 2016-05-12 ENCOUNTER — Ambulatory Visit (INDEPENDENT_AMBULATORY_CARE_PROVIDER_SITE_OTHER): Payer: BLUE CROSS/BLUE SHIELD | Admitting: Physician Assistant

## 2016-05-12 ENCOUNTER — Encounter: Payer: Self-pay | Admitting: Physician Assistant

## 2016-05-12 VITALS — BP 140/80 | HR 80 | Temp 98.9°F | Resp 16 | Ht 65.0 in | Wt 148.0 lb

## 2016-05-12 DIAGNOSIS — F988 Other specified behavioral and emotional disorders with onset usually occurring in childhood and adolescence: Secondary | ICD-10-CM | POA: Diagnosis not present

## 2016-05-12 MED ORDER — AMPHETAMINE-DEXTROAMPHET ER 30 MG PO CP24: 30.0000 mg | ORAL_CAPSULE | Freq: Every day | ORAL | 0 refills | Status: DC

## 2016-05-12 MED ORDER — AMPHETAMINE-DEXTROAMPHET ER 30 MG PO CP24: 30.0000 mg | ORAL_CAPSULE | ORAL | 0 refills | Status: DC

## 2016-05-12 NOTE — Patient Instructions (Addendum)
Let me know if the increased dose causes jitteriness or isn't effective enough.     IF you received an x-ray today, you will receive an invoice from [facility]. Please contact [facility] at [phone] with questions or concerns regarding your invoice.   IF you received labwork today, you will receive an invoice from [facility]. Please contact [facility] at [phone] with questions or concerns regarding your invoice.   Our billing staff will not be able to assist you with questions regarding bills from these companies.  You will be contacted with the lab results as soon as they are available. The fastest way to get your results is to activate your My Chart account. Instructions are located on the last page of this paperwork. If you have not heard from [place] regarding the results in 2 weeks, please contact this office.

## 2016-05-12 NOTE — Progress Notes (Signed)
Patient ID: Anita Snow, female    DOB: [birth year], 58 y.o.   MRN: [id number]  PCP: Harrison Mons, PA-C  Chief Complaint  Patient presents with  . Medication Refill    adderall    Subjective:   Presents for evaluation of ADD.  Adderall XR doesn't have the adverse effects, but still isn't as productive as she was on the Vyvanse. Has received the brand name the last couple of fills, and it seems to work better than the generic. Has had 13 lbs weight loss with [employer].  Parents in law both have dementia. She feels really stressed about it. Husband is really involved in their care.   Review of Systems As above. No CP, SOB, HA, dizziness, palpitations, GI distress.    Patient Active Problem List   Diagnosis Date Noted  . Hyperlipidemia 11/12/2015  . Generalized abdominal pain 01/20/2015  . Bloating 01/20/2015  . Nausea with vomiting 01/20/2015  . Diarrhea 01/20/2015  . Menopausal and perimenopausal disorder 2014 11/23/2012  . ADD (attention deficit disorder) 08/05/2011  . AR (allergic rhinitis) 08/05/2011  . DDD (degenerative disc disease), cervical 08/05/2011  . Osteoarthritis, shoulder 08/05/2011  . Osteopenia 08/05/2011  . ESOPHAGEAL REFLUX 03/26/2009  . OSTEOARTHRITIS 03/26/2009     Prior to Admission medications   Medication Sig Start Date End Date Taking? Authorizing Provider  amphetamine-dextroamphetamine (ADDERALL XR) 20 MG 24 hr capsule Take 1 capsule (20 mg total) by mouth daily. 04/27/16  Yes Mancel Bale, PA-C  cycloSPORINE (RESTASIS) 0.05 % ophthalmic emulsion Place 1 drop into both eyes 2 (two) times daily.   Yes Historical Provider, MD  esomeprazole (NEXIUM) 20 MG capsule TAKE 1 CAPSULE DAILY Patient taking differently: Take 20 mg by mouth every morning. TAKE 1 CAPSULE DAILY 04/27/12  Yes Leandrew Koyanagi, MD  MINIVELLE 0.05 MG/24HR patch Place 1 patch onto the skin 2 (two) times a week.  01/28/14  Yes Historical Provider, MD    progesterone (PROMETRIUM) 100 MG capsule Take 100 mg by mouth every evening.    Yes Historical Provider, MD  saccharomyces boulardii (FLORASTOR) 250 MG capsule Take 1 capsule (250 mg total) by mouth 2 (two) times daily. Patient taking differently: Take 250 mg by mouth 2 (two) times daily as needed.  01/04/15  Yes Jessica D Zehr, PA-C  albuterol (PROVENTIL HFA;VENTOLIN HFA) 108 (90 BASE) MCG/ACT inhaler Inhale 2 puffs into the lungs every 6 (six) hours as needed for wheezing. Patient not taking: Reported on 05/12/2016 11/24/12   Leandrew Koyanagi, MD  fluticasone [facility]) 50 MCG/ACT nasal spray Place 2 sprays into the nose daily. Patient not taking: Reported on 05/12/2016 11/23/12   Leandrew Koyanagi, MD  XIIDRA 5 % SOLN INT 1 GTT IN OU BID 02/07/16   Historical Provider, MD     Allergies  Allergen Reactions  . Cephalosporins Swelling  . Shellfish Allergy Swelling    Lip swells and hands swell  8 to 10 hours after being around it       Objective:  Physical Exam  Constitutional: She is oriented to person, place, and time. She appears well-developed and well-nourished. She is active and cooperative. No distress.  BP 140/80   Pulse 80   Temp 98.9 F (37.2 C) (Oral)   Resp 16   Ht 5\' 5"  (1.651 m)   Wt 148 lb (67.1 kg)   LMP 11/13/2012   SpO2 100%   BMI 24.63 kg/m    Eyes: Conjunctivae are normal.  Pulmonary/Chest: Effort normal.  Neurological: She is alert and oriented to person, place, and time.  Psychiatric: She has a normal mood and affect. Her speech is normal and behavior is normal.           Assessment & Plan:   1. Attention deficit disorder (ADD) without hyperactivity Stable. Tolerates Adderall XR better than Vyvanse, but less effective. Increase dose to see if she can get better results without adverse effects. - amphetamine-dextroamphetamine (ADDERALL XR) 30 MG 24 hr capsule; Take 1 capsule (30 mg total) by mouth daily.  Dispense: 30 capsule; Refill: 0 -  amphetamine-dextroamphetamine (ADDERALL XR) 30 MG 24 hr capsule; Take 1 capsule (30 mg total) by mouth daily.  Dispense: 30 capsule; Refill: 0 - amphetamine-dextroamphetamine (ADDERALL XR) 30 MG 24 hr capsule; Take 1 capsule (30 mg total) by mouth every morning.  Dispense: 30 capsule; Refill: 0    Return in about 6 months (around 11/12/2016).   Fara Chute, PA-C [facility]

## 2016-08-03 ENCOUNTER — Telehealth: Payer: Self-pay | Admitting: Physician Assistant

## 2016-08-03 DIAGNOSIS — F988 Other specified behavioral and emotional disorders with onset usually occurring in childhood and adolescence: Secondary | ICD-10-CM

## 2016-08-03 NOTE — Telephone Encounter (Signed)
Pt is needing a refill on adderall xr 30 mg usually is is for 3 months at a time and she is not scheduled for an appt until sept.

## 2016-08-04 ENCOUNTER — Telehealth: Payer: Self-pay

## 2016-08-04 MED ORDER — AMPHETAMINE-DEXTROAMPHET ER 30 MG PO CP24: 30.0000 mg | ORAL_CAPSULE | Freq: Every day | ORAL | 0 refills | Status: DC

## 2016-08-04 MED ORDER — AMPHETAMINE-DEXTROAMPHET ER 30 MG PO CP24: 30.0000 mg | ORAL_CAPSULE | ORAL | 0 refills | Status: DC

## 2016-08-04 NOTE — Telephone Encounter (Signed)
Called pt and advised that rx was ready for her to pick up at [facility].

## 2016-08-04 NOTE — Telephone Encounter (Signed)
Meds ordered this encounter  Medications  . amphetamine-dextroamphetamine (ADDERALL XR) 30 MG 24 hr capsule    Sig: Take 1 capsule (30 mg total) by mouth every morning.    Dispense:  30 capsule    Refill:  0    Order Specific Question:   Supervising Provider    Answer:   SHAW, EVA N [[id number]]  . amphetamine-dextroamphetamine (ADDERALL XR) 30 MG 24 hr capsule    Sig: Take 1 capsule (30 mg total) by mouth daily.    Dispense:  30 capsule    Refill:  0    May fill 60 days after date on prescription    Order Specific Question:   Supervising Provider    Answer:   Brigitte Pulse, EVA N [[id number]]  . amphetamine-dextroamphetamine (ADDERALL XR) 30 MG 24 hr capsule    Sig: Take 1 capsule (30 mg total) by mouth daily.    Dispense:  30 capsule    Refill:  0    May fill 30 days after date on prescription    Order Specific Question:   Supervising Provider    Answer:   Brigitte Pulse, EVA N [[id number]]

## 2016-11-17 ENCOUNTER — Encounter: Payer: Self-pay | Admitting: Physician Assistant

## 2016-11-17 ENCOUNTER — Ambulatory Visit (INDEPENDENT_AMBULATORY_CARE_PROVIDER_SITE_OTHER): Payer: BLUE CROSS/BLUE SHIELD | Admitting: Physician Assistant

## 2016-11-17 VITALS — BP 130/80 | HR 93 | Temp 98.8°F | Resp 18 | Ht 65.0 in | Wt 147.8 lb

## 2016-11-17 DIAGNOSIS — J302 Other seasonal allergic rhinitis: Secondary | ICD-10-CM

## 2016-11-17 DIAGNOSIS — F988 Other specified behavioral and emotional disorders with onset usually occurring in childhood and adolescence: Secondary | ICD-10-CM

## 2016-11-17 DIAGNOSIS — Z23 Encounter for immunization: Secondary | ICD-10-CM | POA: Diagnosis not present

## 2016-11-17 MED ORDER — ALBUTEROL SULFATE HFA 108 (90 BASE) MCG/ACT IN AERS: 2.0000 | INHALATION_SPRAY | Freq: Four times a day (QID) | RESPIRATORY_TRACT | 0 refills | Status: AC | PRN

## 2016-11-17 MED ORDER — AMPHETAMINE-DEXTROAMPHET ER 25 MG PO CP24: 25.0000 mg | ORAL_CAPSULE | ORAL | 0 refills | Status: DC

## 2016-11-17 NOTE — Progress Notes (Signed)
Patient ID: Anita Snow, female    DOB: 1960/07/07, 56 y.o.   MRN: [id number]  PCP: Harrison Mons, PA-C  Chief Complaint  Patient presents with  . ADD    Pt states the Adderall XR may be too strong. Pt states she feels jittery and can feel her heart racing, and over focused.  . Follow-up    6 month follow up  . Medication Refill    Albuterol Sulfate inhaler     Subjective:   Presents for evaluation of ADD.  We previously INCREASED the Adderall XR dose from 20 mg to 30 mg trying to achieve better symptoms control. Unfortunately, she has been experiencing reduced appetite and heart racing, and often feels "over focused," such that she become even less productive. She would like to reduce the dose.  She has more stress recently: parents in law are in assisted living here locally, and she helps to care for them, her mother has dementia (and lives in [place]), and her son has graduated from [employer] and moved back in.     Review of Systems Constitutional: Positive for appetite change.  Respiratory: Negative for cough, choking and shortness of breath.   Cardiovascular: Positive for palpitations. Negative for chest pain.  Gastrointestinal: Negative for abdominal distention, constipation, diarrhea, nausea and vomiting.  Endocrine: Negative for cold intolerance and heat intolerance.  Neurological: Negative for dizziness, syncope, light-headedness and headaches.  Psychiatric/Behavioral: Negative for sleep disturbance.     Patient Active Problem List   Diagnosis Date Noted  . Hyperlipidemia 11/12/2015  . Generalized abdominal pain 01/20/2015  . Bloating 01/20/2015  . Nausea with vomiting 01/20/2015  . Diarrhea 01/20/2015  . Menopausal and perimenopausal disorder 2014 11/23/2012  . ADD (attention deficit disorder) 08/05/2011  . AR (allergic rhinitis) 08/05/2011  . DDD (degenerative disc disease), cervical 08/05/2011  . Osteoarthritis, shoulder 08/05/2011  . Osteopenia  08/05/2011  . ESOPHAGEAL REFLUX 03/26/2009  . OSTEOARTHRITIS 03/26/2009     Prior to Admission medications   Medication Sig Start Date End Date Taking? Authorizing Provider  albuterol (PROVENTIL HFA;VENTOLIN HFA) 108 (90 BASE) MCG/ACT inhaler Inhale 2 puffs into the lungs every 6 (six) hours as needed for wheezing. 11/24/12  Yes Leandrew Koyanagi, MD  cycloSPORINE (RESTASIS) 0.05 % ophthalmic emulsion Place 1 drop into both eyes 2 (two) times daily.   Yes [provider]  esomeprazole (NEXIUM) 20 MG capsule TAKE 1 CAPSULE DAILY Patient taking differently: Take 20 mg by mouth every morning. TAKE 1 CAPSULE DAILY 04/27/12  Yes Leandrew Koyanagi, MD  MINIVELLE 0.05 MG/24HR patch Place 1 patch onto the skin 2 (two) times a week.  01/28/14  Yes [provider]  progesterone (PROMETRIUM) 100 MG capsule Take 100 mg by mouth every evening.    Yes [provider]  fluticasone (FLONASE) 50 MCG/ACT nasal spray Place 2 sprays into the nose daily. Patient not taking: Reported on 05/12/2016 11/23/12   Leandrew Koyanagi, MD  saccharomyces boulardii (FLORASTOR) 250 MG capsule Take 1 capsule (250 mg total) by mouth 2 (two) times daily. Patient not taking: Reported on 11/17/2016 01/04/15   Zehr, Laban Emperor, PA-C  XIIDRA 5 % SOLN INT 1 GTT IN OU BID 02/07/16   [provider]     Allergies  Allergen Reactions  . Cephalosporins Swelling  . Shellfish Allergy Swelling    Lip swells and hands swell  8 to 10 hours after being around it       Objective:  Physical Exam  Constitutional: She is oriented to person, place, and time. She appears well-developed and well-nourished. She is active and cooperative. No distress.  BP 130/80 (BP Location: Left Arm, Patient Position: Sitting, Cuff Size: Normal)   Pulse 93   Temp 98.8 F (37.1 C) (Oral)   Resp 18   Ht 5\' 5"  (1.651 m)   Wt 147 lb 12.8 oz (67 kg)   LMP 11/13/2012   SpO2 97%   BMI 24.60 kg/m   HENT:  Head:  Normocephalic and atraumatic.  Right Ear: Hearing normal.  Left Ear: Hearing normal.  Eyes: Conjunctivae are normal. No scleral icterus.  Neck: Normal range of motion. Neck supple. No thyromegaly present.  Cardiovascular: Normal rate, regular rhythm and normal heart sounds.   Pulses:      Radial pulses are 2+ on the right side, and 2+ on the left side.  Pulmonary/Chest: Effort normal and breath sounds normal.  Lymphadenopathy:       Head (right side): No tonsillar, no preauricular, no posterior auricular and no occipital adenopathy present.       Head (left side): No tonsillar, no preauricular, no posterior auricular and no occipital adenopathy present.    She has no cervical adenopathy.       Right: No supraclavicular adenopathy present.       Left: No supraclavicular adenopathy present.  Neurological: She is alert and oriented to person, place, and time. No sensory deficit.  Skin: Skin is warm, dry and intact. No rash noted. No cyanosis or erythema. Nails show no clubbing.  Psychiatric: She has a normal mood and affect. Her speech is normal and behavior is normal.           Assessment & Plan:   Problem List Items Addressed This Visit    ADD (attention deficit disorder) - Primary    Adverse effects of medication with increased dose. REDUCE back to 20 mg. Consider trying 25 mg in the future, or change to Vyvanse.      Relevant Medications   amphetamine-dextroamphetamine (ADDERALL XR) 25 MG 24 hr capsule   AR (allergic rhinitis)   Relevant Medications   albuterol (PROVENTIL HFA;VENTOLIN HFA) 108 (90 Base) MCG/ACT inhaler    Other Visit Diagnoses    Flu vaccine need       Relevant Orders   Flu Vaccine QUAD 36+ mos IM (Completed)       Return in about 4 months (around 03/20/2017) for re-evaluation of ADD.   Fara Chute, PA-C [facility]

## 2016-11-17 NOTE — Patient Instructions (Signed)
     IF you received an x-ray today, you will receive an invoice from [facility]. Please contact [facility] at [phone] with questions or concerns regarding your invoice.   IF you received labwork today, you will receive an invoice from [facility]. Please contact [facility] at [phone] with questions or concerns regarding your invoice.   Our billing staff will not be able to assist you with questions regarding bills from these companies.  You will be contacted with the lab results as soon as they are available. The fastest way to get your results is to activate your My Chart account. Instructions are located on the last page of this paperwork. If you have not heard from us regarding the results in 2 weeks, please contact this office.     

## 2016-11-17 NOTE — Progress Notes (Signed)
Subjective:    Patient ID: Anita Snow, female    DOB: 10-24-1960, 56 y.o.   MRN: [id number]  HPI Chief Complaint  Patient presents with  . ADD    Pt states the Adderall XR may be too strong. Pt states she feels jittery and can feel her heart racing, and over focused.  . Follow-up    6 month follow up  . Medication Refill    Albuterol Sulfate inhaler    Patient returns today for re-evaluation of Adderall XR dose change from 20mg  to 30mg . She states that she thinks the dose is too high as her heart races and has had decreased appetite. She notes that she can get over focused and ends up being unproductive. Patient think decreasing her dose would help.  She states that she has had increased stress in her life as her parent in laws are in assisted living, her mother lives in [place] with dementia, and her adult son who graduated from [employer] has moved back in with her and her husband. She also has had dental procedures recently and increased ocular pressure in the LEFT eye, which has added more stress to her. She states she is on the tail end of hot flashes.   Review of Systems  Constitutional: Positive for appetite change.  Respiratory: Negative for cough, choking and shortness of breath.   Cardiovascular: Positive for palpitations. Negative for chest pain.  Gastrointestinal: Negative for abdominal distention, constipation, diarrhea, nausea and vomiting.  Endocrine: Negative for cold intolerance and heat intolerance.  Neurological: Negative for dizziness, syncope, light-headedness and headaches.  Psychiatric/Behavioral: Negative for sleep disturbance.   Patient Active Problem List   Diagnosis Date Noted  . Hyperlipidemia 11/12/2015  . Generalized abdominal pain 01/20/2015  . Bloating 01/20/2015  . Nausea with vomiting 01/20/2015  . Diarrhea 01/20/2015  . Menopausal and perimenopausal disorder 2014 11/23/2012  . ADD (attention deficit disorder) 08/05/2011  . AR (allergic rhinitis)  08/05/2011  . DDD (degenerative disc disease), cervical 08/05/2011  . Osteoarthritis, shoulder 08/05/2011  . Osteopenia 08/05/2011  . ESOPHAGEAL REFLUX 03/26/2009  . OSTEOARTHRITIS 03/26/2009   Prior to Admission medications   Medication Sig Start Date End Date Taking? Authorizing Provider  albuterol (PROVENTIL HFA;VENTOLIN HFA) 108 (90 BASE) MCG/ACT inhaler Inhale 2 puffs into the lungs every 6 (six) hours as needed for wheezing. 11/24/12  Yes Leandrew Koyanagi, MD  cycloSPORINE (RESTASIS) 0.05 % ophthalmic emulsion Place 1 drop into both eyes 2 (two) times daily.   Yes [provider]  esomeprazole (NEXIUM) 20 MG capsule TAKE 1 CAPSULE DAILY Patient taking differently: Take 20 mg by mouth every morning. TAKE 1 CAPSULE DAILY 04/27/12  Yes Leandrew Koyanagi, MD  MINIVELLE 0.05 MG/24HR patch Place 1 patch onto the skin 2 (two) times a week.  01/28/14  Yes [provider]  progesterone (PROMETRIUM) 100 MG capsule Take 100 mg by mouth every evening.    Yes [provider]  amphetamine-dextroamphetamine (ADDERALL XR) 30 MG 24 hr capsule Take 1 capsule (30 mg total) by mouth every morning. 08/04/16 09/03/16  Harrison Mons, PA-C  amphetamine-dextroamphetamine (ADDERALL XR) 30 MG 24 hr capsule Take 1 capsule (30 mg total) by mouth daily. 08/04/16 09/03/16  Harrison Mons, PA-C  amphetamine-dextroamphetamine (ADDERALL XR) 30 MG 24 hr capsule Take 1 capsule (30 mg total) by mouth daily. 08/04/16 09/03/16  Harrison Mons, PA-C  fluticasone (FLONASE) 50 MCG/ACT nasal spray Place 2 sprays into the nose daily. Patient not taking: Reported on 05/12/2016  11/23/12   Leandrew Koyanagi, MD  saccharomyces boulardii (FLORASTOR) 250 MG capsule Take 1 capsule (250 mg total) by mouth 2 (two) times daily. Patient not taking: Reported on 11/17/2016 01/04/15   Zehr, Laban Emperor, PA-C  XIIDRA 5 % SOLN INT 1 GTT IN OU BID 02/07/16   [provider]   Allergies  Allergen Reactions  .  Cephalosporins Swelling  . Shellfish Allergy Swelling    Lip swells and hands swell  8 to 10 hours after being around it   Social History   Social History  . Marital status: Married    Spouse name: Richardson Landry  . Number of children: 2  . Years of education: College   Occupational History  . [employer]    also does administrative work   Social History Main Topics  . Smoking status: Never Smoker  . Smokeless tobacco: Never Used  . Alcohol use Yes     Comment: SOCIAL  . Drug use: No  . Sexual activity: Yes    Partners: Male    Birth control/ protection: Condom   Other Topics Concern  . Not on file   Social History Narrative   Lives with her husband.   Two adult sons.   1 son graduated from [employer]. Other son graduated from [employer] and moved home.    Her parents live in [place]. Mom with dementia.    Parent in laws are in assisted living.    Certified Yoga instructor.      Objective:   Physical Exam  Constitutional: She appears well-developed and well-nourished. No distress.  HENT:  Head: Normocephalic and atraumatic.  Right Ear: External ear normal.  Left Ear: External ear normal.  Cardiovascular: Normal rate, regular rhythm, normal heart sounds and intact distal pulses.  Exam reveals no gallop and no friction rub.   No murmur heard. Pulmonary/Chest: Effort normal and breath sounds normal. No respiratory distress. She has no wheezes. She has no rales. She exhibits no tenderness.  Skin: Skin is warm and dry. No rash noted. She is not diaphoretic. No erythema. No pallor.  Psychiatric: She has a normal mood and affect. Her behavior is normal. Judgment and thought content normal.      Assessment & Plan:  1. Attention deficit disorder (ADD) without hyperactivity - Decreased current dose form 30mg  to 25mg . If this does not help, patient is willing to consider Vyvanse again.  - amphetamine-dextroamphetamine (ADDERALL XR) 25 MG 24 hr capsule; Take 1 capsule  by mouth every morning.  Dispense: 30 capsule; Refill: 0  2. Flu vaccine need - Completed today in office.  - Flu Vaccine QUAD 36+ mos IM  Follow up as needed.  Respectfully, Denny Levy PA-S 2019

## 2016-11-23 NOTE — Assessment & Plan Note (Signed)
Adverse effects of medication with increased dose. REDUCE back to 20 mg. Consider trying 25 mg in the future, or change to Vyvanse.

## 2016-11-23 NOTE — Assessment & Plan Note (Signed)
Refill albuterol inhaler for seasonal symptoms

## 2016-12-14 ENCOUNTER — Telehealth: Payer: Self-pay | Admitting: Physician Assistant

## 2016-12-14 DIAGNOSIS — F988 Other specified behavioral and emotional disorders with onset usually occurring in childhood and adolescence: Secondary | ICD-10-CM

## 2016-12-14 NOTE — Telephone Encounter (Signed)
Pt is needing a refill on her adderall xr 25   Best number [phone]

## 2016-12-14 NOTE — Telephone Encounter (Signed)
Please advise 

## 2016-12-15 ENCOUNTER — Telehealth: Payer: Self-pay

## 2016-12-15 MED ORDER — AMPHETAMINE-DEXTROAMPHET ER 25 MG PO CP24: 25.0000 mg | ORAL_CAPSULE | ORAL | 0 refills | Status: DC

## 2016-12-15 NOTE — Telephone Encounter (Signed)
Meds ordered this encounter  Medications  . amphetamine-dextroamphetamine (ADDERALL XR) 25 MG 24 hr capsule    Sig: Take 1 capsule by mouth every morning.    Dispense:  30 capsule    Refill:  0    Order Specific Question:   Supervising Provider    Answer:   Brigitte Pulse, EVA N [[id number]]

## 2017-01-11 ENCOUNTER — Telehealth: Payer: Self-pay | Admitting: Physician Assistant

## 2017-01-11 DIAGNOSIS — F988 Other specified behavioral and emotional disorders with onset usually occurring in childhood and adolescence: Secondary | ICD-10-CM

## 2017-01-11 NOTE — Telephone Encounter (Signed)
CRM for notification. See Telephone encounter for: 01/11/17. Patient would like refill on her amphetamine-dextroamphetamine (ADDERALL XR) 25 MG 24 hr capsule. Please call patient back when it is ready for pick-up.

## 2017-01-11 NOTE — Telephone Encounter (Signed)
Please advise 

## 2017-01-11 NOTE — Telephone Encounter (Signed)
Copied from [place]. Topic: Quick Communication - See Telephone Encounter >> Jan 11, 2017  2:41 PM Robina Ade, Helene Kelp D wrote: CRM for notification. See Telephone encounter for: 01/11/17. Patient would like refill on her amphetamine-dextroamphetamine (ADDERALL XR) 25 MG 24 hr capsule. Please call patient back when it is ready for pick-up.

## 2017-01-13 MED ORDER — AMPHETAMINE-DEXTROAMPHET ER 25 MG PO CP24: 25.0000 mg | ORAL_CAPSULE | ORAL | 0 refills | Status: DC

## 2017-01-13 NOTE — Telephone Encounter (Signed)
Rx sent electronically.  Meds ordered this encounter  Medications  . amphetamine-dextroamphetamine (ADDERALL XR) 25 MG 24 hr capsule    Sig: Take 1 capsule by mouth every morning.    Dispense:  30 capsule    Refill:  0    Order Specific Question:   Supervising Provider    Answer:   Brigitte Pulse, EVA N [[id number]]

## 2017-02-12 NOTE — Telephone Encounter (Signed)
Error-Sign Encounter 

## 2017-02-15 ENCOUNTER — Telehealth: Payer: Self-pay | Admitting: Physician Assistant

## 2017-02-15 DIAGNOSIS — F988 Other specified behavioral and emotional disorders with onset usually occurring in childhood and adolescence: Secondary | ICD-10-CM

## 2017-02-15 MED ORDER — AMPHETAMINE-DEXTROAMPHET ER 25 MG PO CP24: 25.0000 mg | ORAL_CAPSULE | ORAL | 0 refills | Status: DC

## 2017-02-15 NOTE — Telephone Encounter (Signed)
Rx sent electronically.  Meds ordered this encounter  Medications  . amphetamine-dextroamphetamine (ADDERALL XR) 25 MG 24 hr capsule    Sig: Take 1 capsule by mouth every morning.    Dispense:  30 capsule    Refill:  0    Order Specific Question:   Supervising Provider    Answer:   Brigitte Pulse, EVA N [[id number]]

## 2017-02-15 NOTE — Telephone Encounter (Signed)
Please advise 

## 2017-02-15 NOTE — Telephone Encounter (Signed)
Copied from [place] [phone]. Topic: Quick Communication - See Telephone Encounter >> Feb 15, 2017 10:32 AM Conception Chancy, NT wrote: CRM for notification. See Telephone encounter for:  02/15/17.  Patient is needing a refill on her adderall sent to [facility] on file. Please advise.   Pt phone number is [phone] incase you need further information.

## 2017-03-29 ENCOUNTER — Telehealth: Payer: Self-pay | Admitting: Physician Assistant

## 2017-03-29 DIAGNOSIS — F988 Other specified behavioral and emotional disorders with onset usually occurring in childhood and adolescence: Secondary | ICD-10-CM

## 2017-03-29 NOTE — Telephone Encounter (Signed)
Copied from [place] [phone]. Topic: Quick Communication - Rx Refill/Question >> Mar 29, 2017  2:14 PM Anita Snow wrote: Medication:  amphetamine-dextroamphetamine (ADDERALL XR) 25 MG 24 hr capsule [[id number]]   Contact pt if needs to be picked up

## 2017-03-31 ENCOUNTER — Telehealth: Payer: Self-pay | Admitting: Physician Assistant

## 2017-03-31 MED ORDER — AMPHETAMINE-DEXTROAMPHET ER 25 MG PO CP24: 25.0000 mg | ORAL_CAPSULE | ORAL | 0 refills | Status: DC

## 2017-03-31 NOTE — Telephone Encounter (Signed)
Adderall refill Last OV: 11/17/16 with Chelle Jeffrey,PA Last Refill: 02/15/17 #30 Pharmacy: [facility], [address]  [place]

## 2017-03-31 NOTE — Telephone Encounter (Signed)
Patient wants to know if she needs to pick this up or will it be sent to pharmacy. Call back is [phone]

## 2017-03-31 NOTE — Telephone Encounter (Signed)
Rx sent electronically.  Meds ordered this encounter  Medications  . amphetamine-dextroamphetamine (ADDERALL XR) 25 MG 24 hr capsule    Sig: Take 1 capsule by mouth every morning.    Dispense:  30 capsule    Refill:  0    Order Specific Question:   Supervising Provider    Answer:   Brigitte Pulse, EVA N [[id number]]    Please advise patient that the Rx was sent and that she needs to see me for more.

## 2017-03-31 NOTE — Telephone Encounter (Signed)
Copied from [place]. Topic: Quick Communication - Rx Refill/Question >> Mar 31, 2017  5:18 PM Cecelia Byars, NT wrote: Medication: amphetamine-dextroamphetamine (ADDERALL XR) 25 MG 24 hr capsule Has the patient contacted their pharmacy? {yes  (Agent: If no, request that the patient contact the pharmacy for the refill.) Preferred Pharmacy (with phone number or street name):[facility] [place], [address] [phone] (Phone) [phone] (Fax) Agent: Please be advised that RX refills may take up to 3 business days. We ask that you follow-up with your pharmacy.  Please call if she needs to pick this up

## 2017-04-01 NOTE — Telephone Encounter (Signed)
Sent 2/13

## 2017-05-05 ENCOUNTER — Other Ambulatory Visit: Payer: Self-pay | Admitting: Physician Assistant

## 2017-05-05 DIAGNOSIS — F988 Other specified behavioral and emotional disorders with onset usually occurring in childhood and adolescence: Secondary | ICD-10-CM

## 2017-05-05 NOTE — Telephone Encounter (Signed)
Copied from [place] [id number]. Topic: General - Other >> May 05, 2017  1:43 PM Darl Householder, RMA wrote: Reason for CRM: Medication refill request for amphetamine-dextroamphetamine (ADDERALL XR) 25 MG 24 hr capsule to be sent to [facility]

## 2017-05-05 NOTE — Telephone Encounter (Signed)
Rx refill request: Adderall XR 25 mg  LOV: 11/17/16   PCP: [place]: verified

## 2017-05-06 ENCOUNTER — Ambulatory Visit (INDEPENDENT_AMBULATORY_CARE_PROVIDER_SITE_OTHER): Payer: BLUE CROSS/BLUE SHIELD | Admitting: Physician Assistant

## 2017-05-06 ENCOUNTER — Other Ambulatory Visit: Payer: Self-pay

## 2017-05-06 ENCOUNTER — Encounter: Payer: Self-pay | Admitting: Physician Assistant

## 2017-05-06 VITALS — BP 161/82 | HR 70 | Temp 98.6°F | Resp 16 | Ht 64.0 in | Wt 150.8 lb

## 2017-05-06 DIAGNOSIS — J0140 Acute pansinusitis, unspecified: Secondary | ICD-10-CM | POA: Diagnosis not present

## 2017-05-06 MED ORDER — GUAIFENESIN ER 1200 MG PO TB12: 1.0000 | ORAL_TABLET | Freq: Two times a day (BID) | ORAL | 1 refills | Status: DC | PRN

## 2017-05-06 MED ORDER — DOXYCYCLINE HYCLATE 100 MG PO CAPS: 100.0000 mg | ORAL_CAPSULE | Freq: Two times a day (BID) | ORAL | 0 refills | Status: AC

## 2017-05-06 NOTE — Progress Notes (Signed)
PRIMARY CARE AT [facility] [address] [place] [address] [phone]  Date:  05/06/2017   Name:  Anita Snow   DOB:  02-Feb-1961   MRN:  [id number]  PCP:  Harrison Mons, PA-C    History of Present Illness:  Anita Snow is a 57 y.o. female patient who presents to PCP with  Chief Complaint  Patient presents with  . Sinus Problem    with pressure, feels like throat is swollen x 3 days  . nasal drainage     3 days of sinus pressure and headache.  she has ear discomfort bilaterally.  She has a sore throat when she is swelling.  The roof of her mouth feels swollen as well.   She does not think she had recent triggers.  She has hard time with talking.  She has sinus pain.   No coughing.  No fever.     Patient Active Problem List   Diagnosis Date Noted  . Hyperlipidemia 11/12/2015  . Generalized abdominal pain 01/20/2015  . Bloating 01/20/2015  . Nausea with vomiting 01/20/2015  . Diarrhea 01/20/2015  . Menopausal and perimenopausal disorder 2014 11/23/2012  . ADD (attention deficit disorder) 08/05/2011  . AR (allergic rhinitis) 08/05/2011  . DDD (degenerative disc disease), cervical 08/05/2011  . Osteoarthritis, shoulder 08/05/2011  . Osteopenia 08/05/2011  . ESOPHAGEAL REFLUX 03/26/2009  . OSTEOARTHRITIS 03/26/2009    Past Medical History:  Diagnosis Date  . ADHD (attention deficit hyperactivity disorder)   . Arthritis   . GERD (gastroesophageal reflux disease)   . Hiatal hernia   . Mass of finger    LEFT THUMB  . Pain of left thumb   . PONV (postoperative nausea and vomiting)   . Wears glasses   . White coat syndrome without hypertension     Past Surgical History:  Procedure Laterality Date  . [place]  . D & C HYSTECSCOPY W/ RESECTION POLYP  04/2015  . KNEE ARTHROSCOPY Right 2010  . MASS EXCISION Left 10/15/2015   Procedure: LEFT THUMB EXCISION MASS WITH ARTHROTOMY AND SYNOVECTOMY;  Surgeon: Roseanne Kaufman, MD;  Location: [place];  Service: Orthopedics;  Laterality: Left;  . SHOULDER ARTHROSCOPY Left 2007   buritis  . TONSILLECTOMY AND ADENOIDECTOMY  as child    Social History   Tobacco Use  . Smoking status: Never Smoker  . Smokeless tobacco: Never Used  Substance Use Topics  . Alcohol use: Yes    Comment: SOCIAL  . Drug use: No    Family History  Problem Relation Age of Onset  . Heart disease Father   . Arthritis Mother   . Alzheimer's disease Mother   . Thyroid disease Mother   . Cancer Maternal Grandmother        lung  . COPD Maternal Grandfather        emphysema  . Heart disease Paternal Grandfather   . Colon cancer Neg Hx     Allergies  Allergen Reactions  . Cephalosporins Swelling  . Shellfish Allergy Swelling    Lip swells and hands swell  8 to 10 hours after being around it    Medication list has been reviewed and updated.  Current Outpatient Medications on File Prior to Visit  Medication Sig Dispense Refill  . albuterol (PROVENTIL HFA;VENTOLIN HFA) 108 (90 Base) MCG/ACT inhaler Inhale 2 puffs into the lungs every 6 (six) hours as needed for wheezing. 1 Inhaler 0  . cycloSPORINE (RESTASIS)  0.05 % ophthalmic emulsion Place 1 drop into both eyes 2 (two) times daily.    Marland Kitchen esomeprazole (NEXIUM) 20 MG capsule TAKE 1 CAPSULE DAILY (Patient taking differently: Take 20 mg by mouth every morning. TAKE 1 CAPSULE DAILY) 90 capsule 3  . FLUoxetine (PROZAC) 20 MG tablet Take 20 mg by mouth daily.    . fluticasone (FLONASE) 50 MCG/ACT nasal spray Place 2 sprays into the nose daily. 16 g 5  . Travoprost, BAK Free, (TRAVATAN) 0.004 % SOLN ophthalmic solution Place 1 drop into the left eye at bedtime.    Marland Kitchen amphetamine-dextroamphetamine (ADDERALL XR) 25 MG 24 hr capsule Take 1 capsule by mouth every morning. 30 capsule 0  . MINIVELLE 0.05 MG/24HR patch Place 1 patch onto the skin 2 (two) times a week.   3  . progesterone (PROMETRIUM) 100 MG capsule Take 100 mg by mouth every  evening.     . saccharomyces boulardii (FLORASTOR) 250 MG capsule Take 1 capsule (250 mg total) by mouth 2 (two) times daily. (Patient not taking: Reported on 11/17/2016) 60 capsule 1  . XIIDRA 5 % SOLN INT 1 GTT IN OU BID  5   No current facility-administered medications on file prior to visit.     ROS ROS otherwise unremarkable unless listed above.  Physical Examination: BP (!) 161/82 (BP Location: Right Arm)   Pulse 70   Temp 98.6 F (37 C) (Oral)   Resp 16   Ht 5\' 4"  (1.626 m)   Wt 150 lb 12.8 oz (68.4 kg)   LMP 11/13/2012   SpO2 98%   BMI 25.88 kg/m  Ideal Body Weight: Weight in (lb) to have BMI = 25: 145.3  Physical Exam  Constitutional: She is oriented to person, place, and time. She appears well-developed and well-nourished. No distress.  HENT:  Head: Normocephalic and atraumatic.  Right Ear: Tympanic membrane, external ear and ear canal normal.  Left Ear: Tympanic membrane, external ear and ear canal normal.  Nose: Mucosal edema and rhinorrhea present. Right sinus exhibits no maxillary sinus tenderness and no frontal sinus tenderness. Left sinus exhibits no maxillary sinus tenderness and no frontal sinus tenderness.  Mouth/Throat: No uvula swelling. Posterior oropharyngeal edema and posterior oropharyngeal erythema present. No oropharyngeal exudate.  Eyes: Pupils are equal, round, and reactive to light. Conjunctivae and EOM are normal.  Cardiovascular: Normal rate and regular rhythm. Exam reveals no gallop, no distant heart sounds and no friction rub.  No murmur heard. Pulmonary/Chest: Effort normal. No respiratory distress. She has no decreased breath sounds. She has no wheezes. She has no rhonchi.  Lymphadenopathy:       Head (right side): No submandibular, no tonsillar, no preauricular and no posterior auricular adenopathy present.       Head (left side): No submandibular, no tonsillar, no preauricular and no posterior auricular adenopathy present.  Neurological: She  is alert and oriented to person, place, and time.  Skin: She is not diaphoretic.  Psychiatric: She has a normal mood and affect. Her behavior is normal.     Assessment and Plan: [name] is a 57 y.o. female who is here today for cc of  Chief Complaint  Patient presents with  . Sinus Problem    with pressure, feels like throat is swollen x 3 days  . nasal drainage   Acute non-recurrent pansinusitis - Plan: doxycycline (VIBRAMYCIN) 100 MG capsule, Guaifenesin (MUCINEX MAXIMUM STRENGTH) 1200 MG TB12  Ivar Drape, PA-C [facility] 3/29/201911:10 AM

## 2017-05-06 NOTE — Patient Instructions (Addendum)
Please hydrate well with 64 oz of water if not more.   Please continue the tylenol sinus.   If your symptoms do not improve, please start the antibiotic in 5 days, or if symptoms we discussed arise.  Sinusitis, Adult Sinusitis is soreness and inflammation of your sinuses. Sinuses are hollow spaces in the bones around your face. They are located:  Around your eyes.  In the middle of your forehead.  Behind your nose.  In your cheekbones.  Your sinuses and nasal passages are lined with a stringy fluid (mucus). Mucus normally drains out of your sinuses. When your nasal tissues get inflamed or swollen, the mucus can get trapped or blocked so air cannot flow through your sinuses. This lets bacteria, viruses, and funguses grow, and that leads to infection. Follow these instructions at home: Medicines  Take, use, or apply over-the-counter and prescription medicines only as told by your doctor. These may include nasal sprays.  If you were prescribed an antibiotic medicine, take it as told by your doctor. Do not stop taking the antibiotic even if you start to feel better. Hydrate and Humidify  Drink enough water to keep your pee (urine) clear or pale yellow.  Use a cool mist humidifier to keep the humidity level in your home above 50%.  Breathe in steam for 10-15 minutes, 3-4 times a day or as told by your doctor. You can do this in the bathroom while a hot shower is running.  Try not to spend time in cool or dry air. Rest  Rest as much as possible.  Sleep with your head raised (elevated).  Make sure to get enough sleep each night. General instructions  Put a warm, moist washcloth on your face 3-4 times a day or as told by your doctor. This will help with discomfort.  Wash your hands often with soap and water. If there is no soap and water, use hand sanitizer.  Do not smoke. Avoid being around people who are smoking (secondhand smoke).  Keep all follow-up visits as told by your  doctor. This is important. Contact a doctor if:  You have a fever.  Your symptoms get worse.  Your symptoms do not get better within 10 days. Get help right away if:  You have a very bad headache.  You cannot stop throwing up (vomiting).  You have pain or swelling around your face or eyes.  You have trouble seeing.  You feel confused.  Your neck is stiff.  You have trouble breathing. This information is not intended to replace advice given to you by your health care provider. Make sure you discuss any questions you have with your health care provider. Document Released: 07/22/2007 Document Revised: 09/29/2015 Document Reviewed: 11/28/2014 Elsevier Interactive Patient Education  2018 [employer].    IF you received an x-ray today, you will receive an invoice from [facility]. Please contact [facility] at [phone] with questions or concerns regarding your invoice.   IF you received labwork today, you will receive an invoice from [place]. Please contact [facility] at [phone] with questions or concerns regarding your invoice.   Our billing staff will not be able to assist you with questions regarding bills from these companies.  You will be contacted with the lab results as soon as they are available. The fastest way to get your results is to activate your My Chart account. Instructions are located on the last page of this paperwork. If you have not heard from [place] regarding the  results in 2 weeks, please contact this office.

## 2017-05-07 NOTE — Telephone Encounter (Signed)
Patient is requesting a refill of the following medications: Requested Prescriptions   Pending Prescriptions Disp Refills  . amphetamine-dextroamphetamine (ADDERALL XR) 25 MG 24 hr capsule 30 capsule 0    Sig: Take 1 capsule by mouth every morning.    Date of patient request: 05/05/17 Last office visit: 11/17/17 Date of last refill: 03/31/17 Last refill amount: #30 0RF Follow up time period per chart: 05/25/17

## 2017-05-10 MED ORDER — AMPHETAMINE-DEXTROAMPHET ER 25 MG PO CP24: 25.0000 mg | ORAL_CAPSULE | ORAL | 0 refills | Status: DC

## 2017-05-10 NOTE — Telephone Encounter (Signed)
Rx sent electronically.  Meds ordered this encounter  Medications  . amphetamine-dextroamphetamine (ADDERALL XR) 25 MG 24 hr capsule    Sig: Take 1 capsule by mouth every morning.    Dispense:  30 capsule    Refill:  0

## 2017-05-24 ENCOUNTER — Encounter: Payer: Self-pay | Admitting: Physician Assistant

## 2017-05-25 ENCOUNTER — Encounter: Payer: Self-pay | Admitting: Physician Assistant

## 2017-05-25 ENCOUNTER — Ambulatory Visit (INDEPENDENT_AMBULATORY_CARE_PROVIDER_SITE_OTHER): Payer: BLUE CROSS/BLUE SHIELD | Admitting: Physician Assistant

## 2017-05-25 ENCOUNTER — Other Ambulatory Visit: Payer: Self-pay

## 2017-05-25 VITALS — BP 128/84 | HR 81 | Temp 98.7°F | Resp 16 | Ht 64.0 in | Wt 150.0 lb

## 2017-05-25 DIAGNOSIS — F988 Other specified behavioral and emotional disorders with onset usually occurring in childhood and adolescence: Secondary | ICD-10-CM | POA: Diagnosis not present

## 2017-05-25 DIAGNOSIS — E785 Hyperlipidemia, unspecified: Secondary | ICD-10-CM | POA: Diagnosis not present

## 2017-05-25 DIAGNOSIS — H409 Unspecified glaucoma: Secondary | ICD-10-CM

## 2017-05-25 NOTE — Patient Instructions (Signed)
     IF you received an x-ray today, you will receive an invoice from [facility]. Please contact [facility] at [phone] with questions or concerns regarding your invoice.   IF you received labwork today, you will receive an invoice from [facility]. Please contact [facility] at [phone] with questions or concerns regarding your invoice.   Our billing staff will not be able to assist you with questions regarding bills from these companies.  You will be contacted with the lab results as soon as they are available. The fastest way to get your results is to activate your My Chart account. Instructions are located on the last page of this paperwork. If you have not heard from us regarding the results in 2 weeks, please contact this office.     

## 2017-05-25 NOTE — Progress Notes (Signed)
Patient ID: Anita Snow, female    DOB: [birth year], 57 y.o.   MRN: [id number]  PCP: Harrison Mons, PA-C  Chief Complaint  Patient presents with  . ADD    follow up   . Hyperlipidemia    follow up     Subjective:   Presents for evaluation of hyperlipidemia and attention deficit disorder.  At her last visit, Adderall was reduced to 25 mg.  She is done well on the reduced dose without adverse effects beyond noticeable mood change if she does not take it.  New diagnosis of glaucoma.  Stopped hormones after her brother's death, concerned about her family history of CAD.  Her brother died in 03-29-22 following a myocardial infarction.  Additional stress from her father-in-law's death at age 26 around the same time.  Since stopping estrogen, she has had difficulty with sleep.  She awakens between 230 and 3 AM frequently.  Fluoxetine was started to help her mood.    Review of Systems As above.  No chest pain, heart palpitations, shortness of breath, dizziness, visual disturbance, nausea, vomiting, diarrhea, urinary symptoms.  Depression screen [facility] 2/9 05/25/2017 05/06/2017 11/17/2016 05/12/2016 11/12/2015  Decreased Interest 0 0 0 0 0  Down, Depressed, Hopeless 0 0 0 0 0  PHQ - 2 Score 0 0 0 0 0     Patient Active Problem List   Diagnosis Date Noted  . Hyperlipidemia 11/12/2015  . Generalized abdominal pain 01/20/2015  . Bloating 01/20/2015  . Nausea with vomiting 01/20/2015  . Diarrhea 01/20/2015  . Menopausal and perimenopausal disorder 2014 11/23/2012  . ADD (attention deficit disorder) 08/05/2011  . AR (allergic rhinitis) 08/05/2011  . DDD (degenerative disc disease), cervical 08/05/2011  . Osteoarthritis, shoulder 08/05/2011  . Osteopenia 08/05/2011  . ESOPHAGEAL REFLUX 03/26/2009  . OSTEOARTHRITIS 03/26/2009     Prior to Admission medications   Medication Sig Start Date End Date Taking? Authorizing Provider  albuterol (PROVENTIL HFA;VENTOLIN HFA) 108 (90  Base) MCG/ACT inhaler Inhale 2 puffs into the lungs every 6 (six) hours as needed for wheezing. 11/17/16   Harrison Mons, PA-C  amphetamine-dextroamphetamine (ADDERALL XR) 25 MG 24 hr capsule Take 1 capsule by mouth every morning. 05/10/17   Harrison Mons, PA-C  cycloSPORINE (RESTASIS) 0.05 % ophthalmic emulsion Place 1 drop into both eyes 2 (two) times daily.    [provider]  esomeprazole (NEXIUM) 20 MG capsule TAKE 1 CAPSULE DAILY Patient taking differently: Take 20 mg by mouth every morning. TAKE 1 CAPSULE DAILY 04/27/12   Leandrew Koyanagi, MD  FLUoxetine (PROZAC) 20 MG tablet Take 20 mg by mouth daily.    [provider]  fluticasone (FLONASE) 50 MCG/ACT nasal spray Place 2 sprays into the nose daily. 11/23/12   Leandrew Koyanagi, MD  Guaifenesin [facility] MAXIMUM STRENGTH) 1200 MG TB12 Take 1 tablet (1,200 mg total) by mouth every 12 (twelve) hours as needed. 05/06/17   English, Colletta Maryland D, PA  MINIVELLE 0.05 MG/24HR patch Place 1 patch onto the skin 2 (two) times a week.  01/28/14   [provider]  progesterone (PROMETRIUM) 100 MG capsule Take 100 mg by mouth every evening.     [provider]  saccharomyces boulardii (FLORASTOR) 250 MG capsule Take 1 capsule (250 mg total) by mouth 2 (two) times daily. Patient not taking: Reported on 11/17/2016 01/04/15   Zehr, Janett Billow D, PA-C  Travoprost, BAK Free, (TRAVATAN) 0.004 % SOLN ophthalmic solution Place 1 drop into the left eye  at bedtime.    [provider]  XIIDRA 5 % SOLN INT 1 GTT IN OU BID 02/07/16   [provider]     Allergies  Allergen Reactions  . Cephalosporins Swelling  . Shellfish Allergy Swelling    Lip swells and hands swell  8 to 10 hours after being around it       Objective:  Physical Exam  Constitutional: She is oriented to person, place, and time. She appears well-developed and well-nourished. She is active and cooperative. No distress.  BP 128/84   Pulse  81   Temp 98.7 F (37.1 C)   Resp 16   Ht 5\' 4"  (1.626 m)   Wt 150 lb (68 kg)   LMP 11/13/2012   SpO2 98%   BMI 25.75 kg/m   HENT:  Head: Normocephalic and atraumatic.  Right Ear: Hearing normal.  Left Ear: Hearing normal.  Eyes: Conjunctivae are normal. No scleral icterus.  Neck: Normal range of motion. Neck supple. No thyromegaly present.  Cardiovascular: Normal rate, regular rhythm and normal heart sounds.  Pulses:      Radial pulses are 2+ on the right side, and 2+ on the left side.  Pulmonary/Chest: Effort normal and breath sounds normal.  Lymphadenopathy:       Head (right side): No tonsillar, no preauricular, no posterior auricular and no occipital adenopathy present.       Head (left side): No tonsillar, no preauricular, no posterior auricular and no occipital adenopathy present.    She has no cervical adenopathy.       Right: No supraclavicular adenopathy present.       Left: No supraclavicular adenopathy present.  Neurological: She is alert and oriented to person, place, and time. No sensory deficit.  Skin: Skin is warm, dry and intact. No rash noted. No cyanosis or erythema. Nails show no clubbing.  Psychiatric: She has a normal mood and affect. Her speech is normal and behavior is normal.      Assessment & Plan:   Problem List Items Addressed This Visit    ADD (attention deficit disorder)    Stable.  Continue Adderall XR 25 mg daily.      Hyperlipidemia - Primary    Await lab results.  Not presently on treatment.  Healthy eating, regular exercise.      Relevant Orders   Comprehensive metabolic panel   Lipid panel   Glaucoma    Follow-up per ophthalmology.          Return in about 3 months (around 08/24/2017) for re-evalaution of ADD, cholesterol, mood.   Fara Chute, PA-C Primary Care at [place]

## 2017-05-25 NOTE — Progress Notes (Signed)
   Subjective:    Patient ID: Anita Snow, female    DOB: 08/06/1960, 57 y.o.   MRN: [id number] Chief Complaint  Patient presents with  . ADD    follow up   . Hyperlipidemia    follow up     HPI  57 yo f presents for 6 month follow up of HLD and ADD.   Adderall was reduced to 25mg  last visit. Reports doing well on her medication and has no issues unless she does not take it for the day. Reports a noticeable mood change if off Adderall. Denies any palpitations or chest pain.  Recently stopped taking Estradiol due to family history of MI and recent passing of her brother from a MI. Due to the recent change she reports some issues with sleep (waking 2:30-3 AM) sometimes. Reports no issues prior to this.  Recently started taking fluoxetine to help regulate mood instead of estradiol.  Brother recently passed away in 04/13/22 from a heart attack. Father in law passed as well. (age 64).   Recently diagnosed with glaucoma in L eye. Has been on travoprost with relief. Will be returning in 1 mo for a follow up.    Review of Systems  Constitutional: Negative.   HENT: Negative.   Eyes: Negative.   Respiratory: Negative.   Cardiovascular: Negative for chest pain and palpitations.  Endocrine: Negative for cold intolerance and heat intolerance.  Genitourinary: Negative.   Musculoskeletal: Negative.   Skin: Negative.   Allergic/Immunologic: Negative.   Neurological: Negative.   Hematological: Negative.   Psychiatric/Behavioral: Negative.        Objective:   Physical Exam  Constitutional: She is oriented to person, place, and time. She appears well-developed and well-nourished. No distress.  HENT:  Head: Normocephalic and atraumatic.  Nose: Nose normal.  Eyes: Pupils are equal, round, and reactive to light. Conjunctivae and EOM are normal. Right eye exhibits no discharge. Left eye exhibits no discharge.  Neck: Normal range of motion. Neck supple. No tracheal deviation present. No  thyromegaly present.  Cardiovascular: Normal rate, regular rhythm and intact distal pulses. Exam reveals no gallop and no friction rub.  No murmur heard. Pulses:      Radial pulses are 2+ on the right side.       Dorsalis pedis pulses are 2+ on the right side.       Posterior tibial pulses are 2+ on the right side.  Pulmonary/Chest: Effort normal and breath sounds normal. No respiratory distress. She has no wheezes. She has no rales.  Musculoskeletal: Normal range of motion. She exhibits no edema.  Neurological: She is alert and oriented to person, place, and time. She has normal reflexes.  Skin: Skin is warm and dry. She is not diaphoretic. No erythema.  Psychiatric: She has a normal mood and affect. Her behavior is normal.       Assessment & Plan:  1. Hyperlipidemia, unspecified hyperlipidemia type Will review labs and f/u  - Comprehensive metabolic panel; Future - Lipid panel; Future  2. Attention deficit disorder (ADD) without hyperactivity F/u in 3 months. No necessary changes at this time.  3. Glaucoma of both eyes, unspecified glaucoma type Pt is being followed by her ophthalmologist and taking travoprost. Will continue to monitor vision.

## 2017-05-26 ENCOUNTER — Encounter: Payer: Self-pay | Admitting: Physician Assistant

## 2017-05-28 ENCOUNTER — Ambulatory Visit (INDEPENDENT_AMBULATORY_CARE_PROVIDER_SITE_OTHER): Payer: BLUE CROSS/BLUE SHIELD | Admitting: Family Medicine

## 2017-05-28 ENCOUNTER — Other Ambulatory Visit: Payer: Self-pay

## 2017-05-28 ENCOUNTER — Encounter: Payer: Self-pay | Admitting: Family Medicine

## 2017-05-28 ENCOUNTER — Other Ambulatory Visit: Payer: Self-pay | Admitting: Physician Assistant

## 2017-05-28 VITALS — BP 120/80 | HR 88 | Temp 98.1°F | Ht 64.0 in | Wt 150.0 lb

## 2017-05-28 DIAGNOSIS — Z23 Encounter for immunization: Secondary | ICD-10-CM

## 2017-05-28 DIAGNOSIS — L03012 Cellulitis of left finger: Secondary | ICD-10-CM | POA: Diagnosis not present

## 2017-05-28 DIAGNOSIS — F988 Other specified behavioral and emotional disorders with onset usually occurring in childhood and adolescence: Secondary | ICD-10-CM

## 2017-05-28 MED ORDER — IBUPROFEN 600 MG PO TABS: 600.0000 mg | ORAL_TABLET | Freq: Three times a day (TID) | ORAL | 0 refills | Status: DC | PRN

## 2017-05-28 MED ORDER — DOXYCYCLINE HYCLATE 100 MG PO TABS: 100.0000 mg | ORAL_TABLET | Freq: Two times a day (BID) | ORAL | 0 refills | Status: DC

## 2017-05-28 NOTE — Patient Instructions (Signed)
     IF you received an x-ray today, you will receive an invoice from [facility]. Please contact [facility] at [phone] with questions or concerns regarding your invoice.   IF you received labwork today, you will receive an invoice from [facility]. Please contact [facility] at [phone] with questions or concerns regarding your invoice.   Our billing staff will not be able to assist you with questions regarding bills from these companies.  You will be contacted with the lab results as soon as they are available. The fastest way to get your results is to activate your My Chart account. Instructions are located on the last page of this paperwork. If you have not heard from us regarding the results in 2 weeks, please contact this office.     

## 2017-05-28 NOTE — Progress Notes (Signed)
4/12/20193:54 PM  ORA BOLLIG 30-Jan-1961, 57 y.o. female [id number]  Chief Complaint  Patient presents with  . Hand Pain    left finger cut weither due to cheese grater or plastic jar. Very inflamed and swollen with throbbing pain    HPI:   Patient is a 57 y.o. female who presents today for left ring finger swollen, red and painful. She states that several days ago she nicked it trying to open a plastic tube. Yesterday she then re-nicked it while grating cheese. She denies any fever or chills.   Depression screen [facility] 2/9 05/28/2017 05/25/2017 05/06/2017  Decreased Interest 0 0 0  Down, Depressed, Hopeless 0 0 0  PHQ - 2 Score 0 0 0    Allergies  Allergen Reactions  . Cephalosporins Swelling  . Shellfish Allergy Swelling    Lip swells and hands swell  8 to 10 hours after being around it    Prior to Admission medications   Medication Sig Start Date End Date Taking? Authorizing Provider  albuterol (PROVENTIL HFA;VENTOLIN HFA) 108 (90 Base) MCG/ACT inhaler Inhale 2 puffs into the lungs every 6 (six) hours as needed for wheezing. 11/17/16  Yes Jeffery, Chelle, PA-C  amphetamine-dextroamphetamine (ADDERALL XR) 25 MG 24 hr capsule Take 1 capsule by mouth every morning. 05/10/17  Yes Jeffery, Chelle, PA-C  cycloSPORINE (RESTASIS) 0.05 % ophthalmic emulsion Place 1 drop into both eyes 2 (two) times daily.   Yes [provider]  esomeprazole (NEXIUM) 20 MG capsule TAKE 1 CAPSULE DAILY Patient taking differently: Take 20 mg by mouth every morning. TAKE 1 CAPSULE DAILY 04/27/12  Yes Leandrew Koyanagi, MD  FLUoxetine (PROZAC) 20 MG tablet Take 20 mg by mouth daily.   Yes [provider]  fluticasone (FLONASE) 50 MCG/ACT nasal spray Place 2 sprays into the nose daily. 11/23/12  Yes Leandrew Koyanagi, MD  Guaifenesin [facility] MAXIMUM STRENGTH) 1200 MG TB12 Take 1 tablet (1,200 mg total) by mouth every 12 (twelve) hours as needed. 05/06/17  Yes English, Stephanie D, PA    Travoprost, BAK Free, (TRAVATAN) 0.004 % SOLN ophthalmic solution Place 1 drop into the left eye at bedtime.   Yes [provider]    Past Medical History:  Diagnosis Date  . ADHD (attention deficit hyperactivity disorder)   . Arthritis   . GERD (gastroesophageal reflux disease)   . Hiatal hernia   . Mass of finger    LEFT THUMB  . Pain of left thumb   . PONV (postoperative nausea and vomiting)   . Wears glasses   . White coat syndrome without hypertension     Past Surgical History:  Procedure Laterality Date  . [place]  . D & C HYSTECSCOPY W/ RESECTION POLYP  04/2015  . KNEE ARTHROSCOPY Right 2010  . MASS EXCISION Left 10/15/2015   Procedure: LEFT THUMB EXCISION MASS WITH ARTHROTOMY AND SYNOVECTOMY;  Surgeon: Roseanne Kaufman, MD;  Location: [place];  Service: Orthopedics;  Laterality: Left;  . SHOULDER ARTHROSCOPY Left 2007   buritis  . TONSILLECTOMY AND ADENOIDECTOMY  as child    Social History   Tobacco Use  . Smoking status: Never Smoker  . Smokeless tobacco: Never Used  Substance Use Topics  . Alcohol use: Yes    Comment: SOCIAL    Family History  Problem Relation Age of Onset  . Heart disease Father   . Arthritis Mother   . Alzheimer's disease Mother   .  Thyroid disease Mother   . Heart disease Brother   . Cancer Maternal Grandmother        lung  . COPD Maternal Grandfather        emphysema  . Heart disease Paternal Grandfather   . Colon cancer Neg Hx     ROS Per hpi  OBJECTIVE:  Blood pressure 120/80, pulse 88, temperature 98.1 F (36.7 C), temperature source Oral, height 5\' 4"  (1.626 m), weight 150 lb (68 kg), last menstrual period 11/13/2012, SpO2 100 %.  Physical Exam  Constitutional: She is oriented to person, place, and time.  HENT:  Head: Normocephalic and atraumatic.  Mouth/Throat: Mucous membranes are normal.  Eyes: Pupils are equal, round, and reactive to light. EOM are normal. No  scleral icterus.  Neck: Neck supple.  Pulmonary/Chest: Effort normal.  Musculoskeletal:  Left hand ring finger at dip joint with swelling and erythema, no drainage noted. FROM. NVI.   Neurological: She is alert and oriented to person, place, and time.  Skin: Skin is warm and dry.  Nursing note and vitals reviewed.    ASSESSMENT and PLAN  1. Cellulitis of finger of left hand 2. Need for vaccination Discussed supportive measures, new meds r/se/b and RTC precautions.  Other orders - doxycycline (VIBRA-TABS) 100 MG tablet; Take 1 tablet (100 mg total) by mouth 2 (two) times daily. - ibuprofen (ADVIL,MOTRIN) 600 MG tablet; Take 1 tablet (600 mg total) by mouth every 8 (eight) hours as needed. - Td vaccine greater than or equal to 7yo preservative free IM  Return in about 3 days (around 05/31/2017).    Rutherford Guys, MD Primary Care at [place], [place] [address] Ph.  [phone] Fax [phone]

## 2017-05-28 NOTE — Telephone Encounter (Signed)
Refill  Request  Adderall  xr  25  Mg   30  Tabs  Last  Refilled  05/10/2017  LOV 05/25/2017  Pharmacy  Requested  [facility]  [address]

## 2017-05-28 NOTE — Telephone Encounter (Unsigned)
Copied from [place] #[id number]. Topic: Quick Communication - Rx Refill/Question >> May 28, 2017  2:47 PM Bea Graff, NT wrote: Medication: amphetamine-dextroamphetamine (ADDERALL XR) 25 MG 24 hr capsule Has the patient contacted their pharmacy? yes (Agent: If no, request that the patient contact the pharmacy for the refill.) Preferred Pharmacy (with phone number or street name): [facility] [place], [place] AT [phone] (Phone) [phone] (Fax)   Agent: Please be advised that RX refills may take up to 3 business days. We ask that you follow-up with your pharmacy.

## 2017-05-29 ENCOUNTER — Encounter: Payer: Self-pay | Admitting: Family Medicine

## 2017-06-01 ENCOUNTER — Ambulatory Visit (INDEPENDENT_AMBULATORY_CARE_PROVIDER_SITE_OTHER): Payer: BLUE CROSS/BLUE SHIELD | Admitting: Family Medicine

## 2017-06-01 ENCOUNTER — Other Ambulatory Visit: Payer: Self-pay

## 2017-06-01 ENCOUNTER — Ambulatory Visit (INDEPENDENT_AMBULATORY_CARE_PROVIDER_SITE_OTHER): Payer: BLUE CROSS/BLUE SHIELD

## 2017-06-01 ENCOUNTER — Ambulatory Visit: Payer: BLUE CROSS/BLUE SHIELD | Admitting: Family Medicine

## 2017-06-01 ENCOUNTER — Encounter: Payer: Self-pay | Admitting: Family Medicine

## 2017-06-01 VITALS — BP 148/80 | HR 80 | Temp 98.6°F | Ht 64.5 in | Wt 150.0 lb

## 2017-06-01 DIAGNOSIS — L03011 Cellulitis of right finger: Secondary | ICD-10-CM

## 2017-06-01 NOTE — Progress Notes (Signed)
4/16/20195:09 PM  Anita Snow March 14, 1960, 57 y.o. female [id number]  Chief Complaint  Patient presents with  . Follow-up    Cellulitis of right finger    HPI:   Patient is a 57 y.o. 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  Depression screen [facility] 2/9 06/01/2017 05/28/2017 05/25/2017  Decreased Interest 0 0 0  Down, Depressed, Hopeless 0 0 0  PHQ - 2 Score 0 0 0    Allergies  Allergen Reactions  . Cephalosporins Swelling  . Shellfish Allergy Swelling    Lip swells and hands swell  8 to 10 hours after being around it    Prior to Admission medications   Medication Sig Start Date End Date Taking? Authorizing Provider  albuterol (PROVENTIL HFA;VENTOLIN HFA) 108 (90 Base) MCG/ACT inhaler Inhale 2 puffs into the lungs every 6 (six) hours as needed for wheezing. 11/17/16  Yes Jeffery, Chelle, PA-C  amphetamine-dextroamphetamine (ADDERALL XR) 25 MG 24 hr capsule Take 1 capsule by mouth every morning. 05/10/17  Yes Jeffery, Chelle, PA-C  cycloSPORINE (RESTASIS) 0.05 % ophthalmic emulsion Place 1 drop into both eyes 2 (two) times daily.   Yes [provider]  doxycycline (VIBRA-TABS) 100 MG tablet Take 1 tablet (100 mg total) by mouth 2 (two) times daily. 05/28/17  Yes Rutherford Guys, MD  esomeprazole (NEXIUM) 20 MG capsule TAKE 1 CAPSULE DAILY Patient taking differently: Take 20 mg by mouth every morning. TAKE 1 CAPSULE DAILY 04/27/12  Yes Leandrew Koyanagi, MD  FLUoxetine (PROZAC) 20 MG tablet Take 20 mg by mouth daily.   Yes [provider]  fluticasone (FLONASE) 50 MCG/ACT nasal spray Place 2 sprays into the nose daily. 11/23/12  Yes Leandrew Koyanagi, MD  Guaifenesin [facility] MAXIMUM STRENGTH) 1200 MG TB12 Take 1 tablet (1,200 mg total) by mouth every 12 (twelve) hours as needed. 05/06/17  Yes English, Colletta [place] D, PA  ibuprofen (ADVIL,MOTRIN) 600 MG tablet Take 1 tablet (600 mg total) by mouth every 8 (eight) hours as needed. 05/28/17  Yes Rutherford Guys, MD  Travoprost, BAK Free, (TRAVATAN) 0.004 % SOLN ophthalmic solution Place 1 drop into the left eye at bedtime.   Yes [provider]    Past Medical History:  Diagnosis Date  . ADHD (attention deficit hyperactivity disorder)   . Arthritis   . GERD (gastroesophageal reflux disease)   . Hiatal hernia   . Mass of finger    LEFT THUMB  . Pain of left thumb   . PONV (postoperative nausea and vomiting)   . Wears glasses   . White coat syndrome without hypertension     Past Surgical History:  Procedure Laterality Date  . [place]  . D & C HYSTECSCOPY W/ RESECTION POLYP  04/2015  . KNEE ARTHROSCOPY Right 2010  . MASS EXCISION Left 10/15/2015   Procedure: LEFT THUMB EXCISION MASS WITH ARTHROTOMY AND SYNOVECTOMY;  Surgeon: Roseanne Kaufman, MD;  Location: [place];  Service: Orthopedics;  Laterality: Left;  .  SHOULDER ARTHROSCOPY Left 2007   buritis  . TONSILLECTOMY AND ADENOIDECTOMY  as child    Social History   Tobacco Use  . Smoking status: Never Smoker  . Smokeless tobacco: Never Used  Substance Use Topics  . Alcohol use: Yes    Comment: SOCIAL    Family History  Problem Relation Age of Onset  . Heart disease Father   . Arthritis Mother   . Alzheimer's disease Mother   . Thyroid disease Mother   . Heart disease Brother   . Cancer Maternal Grandmother        lung  . COPD Maternal Grandfather        emphysema  . Heart disease Paternal Grandfather   . Colon cancer Neg Hx     ROS Per  hpi  OBJECTIVE:  Blood pressure (!) 148/80, pulse 80, temperature 98.6 F (37 C), temperature source Oral, height 5' 4.5" (1.638 m), weight 150 lb (68 kg), last menstrual period 11/13/2012, SpO2 100 %.  Physical Exam  Gen: AAOx3, NAD Right index finger: FROM, no redness, warmth or TTP, DIP with bony changes. NVI.    Dg Hand 2 View Right  Result Date: 06/01/2017 CLINICAL DATA:  Cellulitis second digit EXAM: RIGHT HAND - 2 VIEW COMPARISON:  None. FINDINGS: Frontal and lateral views were obtained. There is generalized soft tissue swelling of the second digit. No soft tissue air or radiopaque foreign body. No fracture or dislocation. There is osteoarthritic change in all DIP joints with small calcifications in the first IP and second DIP joints. Mild calcification is noted in the third PIP joint. No erosive changes. IMPRESSION: Areas of osteoarthritic change, most notably in the first IP and second DIP joints. Soft tissue swelling second digit. No radiopaque foreign body or soft tissue air. No fracture or dislocation. Electronically Signed   By: Lowella Grip III M.D.   On: 06/01/2017 17:36     ASSESSMENT and PLAN  1. Cellulitis of finger of right hand Resolving, complete antibiotic. NSAIDS prn. RTC precautions given. - DG Hand 2 View Right; Future  Return if symptoms worsen or fail to improve.    Rutherford Guys, MD Primary Care at [place],  [address] Ph.  [phone] Fax [phone]

## 2017-06-01 NOTE — Patient Instructions (Signed)
     IF you received an x-ray today, you will receive an invoice from [facility]. Please contact [facility] at [phone] with questions or concerns regarding your invoice.   IF you received labwork today, you will receive an invoice from [facility]. Please contact [facility] at [phone] with questions or concerns regarding your invoice.   Our billing staff will not be able to assist you with questions regarding bills from these companies.  You will be contacted with the lab results as soon as they are available. The fastest way to get your results is to activate your My Chart account. Instructions are located on the last page of this paperwork. If you have not heard from us regarding the results in 2 weeks, please contact this office.     

## 2017-06-03 ENCOUNTER — Encounter: Payer: Self-pay | Admitting: Family Medicine

## 2017-06-04 MED ORDER — AMPHETAMINE-DEXTROAMPHET ER 25 MG PO CP24: 25.0000 mg | ORAL_CAPSULE | ORAL | 0 refills | Status: DC

## 2017-06-04 NOTE — Telephone Encounter (Signed)
Patient is requesting a refill of the following medications: Requested Prescriptions   Pending Prescriptions Disp Refills  . amphetamine-dextroamphetamine (ADDERALL XR) 25 MG 24 hr capsule 30 capsule 0    Sig: Take 1 capsule by mouth every morning.    Date of patient request: 05/28/17 Last office visit: 05/25/17 Date of last refill: 05/10/17 Last refill amount: #30 0RF Follow up time period per chart: around 08/24/17 nothing scheduled at this time

## 2017-06-04 NOTE — Telephone Encounter (Signed)
Rx sent electronically.  Meds ordered this encounter  Medications  . amphetamine-dextroamphetamine (ADDERALL XR) 25 MG 24 hr capsule    Sig: Take 1 capsule by mouth every morning.    Dispense:  30 capsule    Refill:  0

## 2017-06-10 ENCOUNTER — Telehealth: Payer: Self-pay | Admitting: Physician Assistant

## 2017-06-10 NOTE — Telephone Encounter (Signed)
Copied from [place] [phone] [id number]. Topic: Quick Communication - Rx Refill/Question >> Jun 10, 2017  5:06 PM Neva Seat wrote:  amphetamine-dextroamphetamine (ADDERALL XR) 25 MG 24 hr capsule   [id number] [place], [place] AT [address] [place] [address] Phone: [phone] Fax: [phone] Not a 24 hour pharmacy; exact hours not known

## 2017-06-12 NOTE — Telephone Encounter (Addendum)
Last filled on 06/04/17. Pharmacy called and states that the prescription was received and that they will refill the medication for the pt.

## 2017-07-04 NOTE — Assessment & Plan Note (Signed)
Follow-up per ophthalmology 

## 2017-07-04 NOTE — Assessment & Plan Note (Signed)
Stable. Continue Adderall XR 25mg daily

## 2017-07-04 NOTE — Assessment & Plan Note (Signed)
Await lab results.  Not presently on treatment.  Healthy eating, regular exercise.

## 2017-07-13 ENCOUNTER — Other Ambulatory Visit: Payer: Self-pay | Admitting: Physician Assistant

## 2017-07-13 DIAGNOSIS — F988 Other specified behavioral and emotional disorders with onset usually occurring in childhood and adolescence: Secondary | ICD-10-CM

## 2017-07-14 ENCOUNTER — Other Ambulatory Visit: Payer: Self-pay | Admitting: Physician Assistant

## 2017-07-14 DIAGNOSIS — F988 Other specified behavioral and emotional disorders with onset usually occurring in childhood and adolescence: Secondary | ICD-10-CM

## 2017-07-14 MED ORDER — AMPHETAMINE-DEXTROAMPHET ER 25 MG PO CP24: 25.0000 mg | ORAL_CAPSULE | ORAL | 0 refills | Status: DC

## 2017-07-14 NOTE — Telephone Encounter (Signed)
Please advise this patient that I have sent the refill, and that I have sent a second prescription to sure that she has an adequate supply until she can see me at [facility] or establish with another provider.  She needs a visit in July.  Meds ordered this encounter  Medications  . amphetamine-dextroamphetamine (ADDERALL XR) 25 MG 24 hr capsule    Sig: Take 1 capsule by mouth every morning.    Dispense:  30 capsule    Refill:  0  . amphetamine-dextroamphetamine (ADDERALL XR) 25 MG 24 hr capsule    Sig: Take 1 capsule by mouth every morning.    Dispense:  30 capsule    Refill:  0    Order Specific Question:   Supervising Provider    Answer:   Brigitte Pulse, EVA N [[id number]]

## 2017-07-16 NOTE — Telephone Encounter (Signed)
duplicate request

## 2017-08-12 ENCOUNTER — Other Ambulatory Visit: Payer: Self-pay | Admitting: Physician Assistant

## 2017-08-12 DIAGNOSIS — F988 Other specified behavioral and emotional disorders with onset usually occurring in childhood and adolescence: Secondary | ICD-10-CM

## 2017-08-12 NOTE — Telephone Encounter (Signed)
Copied from [place] [id number]. Topic: Quick Communication - Rx Refill/Question >> Aug 12, 2017  8:29 AM Anita Snow L wrote: Medication: amphetamine-dextroamphetamine (ADDERALL XR) 25 MG 24 hr capsule  Has the patient contacted their pharmacy? Yes.   (Agent: If no, request that the patient contact the pharmacy for the refill.) (Agent: If yes, when and what did the pharmacy advise?)  Preferred Pharmacy (with phone number or street name): [facility] [place], [place] AT [address] [phone] (Phone) [phone] (Fax)      Agent: Please be advised that RX refills may take up to 3 business days. We ask that you follow-up with your pharmacy.

## 2017-08-13 ENCOUNTER — Other Ambulatory Visit: Payer: Self-pay | Admitting: Physician Assistant

## 2017-08-13 DIAGNOSIS — F988 Other specified behavioral and emotional disorders with onset usually occurring in childhood and adolescence: Secondary | ICD-10-CM

## 2017-08-13 MED ORDER — AMPHETAMINE-DEXTROAMPHET ER 25 MG PO CP24: 25.0000 mg | ORAL_CAPSULE | ORAL | 0 refills | Status: DC

## 2017-08-13 NOTE — Telephone Encounter (Signed)
Refill of adderall  LOV 06/01/17  Dr. Pamella Pert     LOV when addressed  05/25/17  C. Dellis Filbert  [facility] 07/14/17  #30  0 refills   [facility] [address]

## 2017-08-13 NOTE — Telephone Encounter (Signed)
Last seen by Harrison Mons in April 2019. Stable. Fu 3 months.  [place] CSR reviewed. Appropriate Med refilled

## 2017-09-10 ENCOUNTER — Telehealth: Payer: Self-pay | Admitting: Family Medicine

## 2017-09-10 DIAGNOSIS — F988 Other specified behavioral and emotional disorders with onset usually occurring in childhood and adolescence: Secondary | ICD-10-CM

## 2017-09-10 NOTE — Telephone Encounter (Signed)
Copied from [place] [phone]. Topic: Quick Communication - Rx Refill/Question >> Sep 10, 2017 10:06 AM Bea Graff, NT wrote: Medication: amphetamine-dextroamphetamine (ADDERALL XR) 25 MG 24 hr capsule  Has the patient contacted their pharmacy? Yes.   (Agent: If no, request that the patient contact the pharmacy for the refill.) (Agent: If yes, when and what did the pharmacy advise?)  Preferred Pharmacy (with phone number or street name): [facility] [place], [address] AT [place] [phone] (Phone) [phone] (Fax)      Agent: Please be advised that RX refills may take up to 3 business days. We ask that you follow-up with your pharmacy.

## 2017-09-13 NOTE — Telephone Encounter (Signed)
Patient is calling and states she flies out of town tomorrow 09/14/17 and would like for this to be done by then if possible.

## 2017-09-14 MED ORDER — AMPHETAMINE-DEXTROAMPHET ER 25 MG PO CP24: 25.0000 mg | ORAL_CAPSULE | ORAL | 0 refills | Status: DC

## 2017-09-14 NOTE — Telephone Encounter (Signed)
[place] CSR reviewed Med refilled x 2 months

## 2017-09-14 NOTE — Addendum Note (Signed)
Addended by: Rutherford Guys on: 09/14/2017 07:03 PM   Modules accepted: Orders

## 2019-04-15 ENCOUNTER — Ambulatory Visit: Payer: Self-pay | Attending: Internal Medicine

## 2019-04-15 DIAGNOSIS — Z23 Encounter for immunization: Secondary | ICD-10-CM | POA: Insufficient documentation

## 2019-04-15 NOTE — Progress Notes (Signed)
   Covid-19 Vaccination Clinic  Name:  Anita Snow    MRN: [id number] DOB: 17-Mar-1960  04/15/2019  [name] was observed post Covid-19 immunization for 15 minutes without incidence. She was provided with Vaccine Information Sheet and instruction to access the V-Safe system.   [name] was instructed to call 911 with any severe reactions post vaccine: [place] Kitchen Difficulty breathing  . Swelling of your face and throat  . A fast heartbeat  . A bad rash all over your body  . Dizziness and weakness    Immunizations Administered    Name Date Dose VIS Date Route   Pfizer COVID-19 Vaccine 04/15/2019  9:28 AM 0.3 mL 01/27/2019 Intramuscular   Manufacturer: [place]   Lot: [id number]   [place]: [id number]

## 2019-05-06 ENCOUNTER — Ambulatory Visit: Payer: Self-pay | Attending: Internal Medicine

## 2019-05-06 DIAGNOSIS — Z23 Encounter for immunization: Secondary | ICD-10-CM

## 2019-05-06 NOTE — Progress Notes (Signed)
   Covid-19 Vaccination Clinic  Name:  Anita Snow    MRN: [id number] DOB: Jun 01, 1960  05/06/2019  [name] was observed post Covid-19 immunization for 15 minutes without incident. She was provided with Vaccine Information Sheet and instruction to access the V-Safe system.   [name] was instructed to call 911 with any severe reactions post vaccine: Mar[place] Kitchen Difficulty breathing  . Swelling of face and throat  . A fast heartbeat  . A bad rash all over body  . Dizziness and weakness   Immunizations Administered    Name Date Dose VIS Date Route   Pfizer COVID-19 Vaccine 05/06/2019  9:02 AM 0.3 mL 01/27/2019 Intramuscular   Manufacturer: [place]   Lot: [id number]   [place]: [id number]

## 2019-05-10 ENCOUNTER — Ambulatory Visit: Payer: Self-pay

## 2019-10-05 ENCOUNTER — Other Ambulatory Visit: Payer: Self-pay | Admitting: Obstetrics and Gynecology

## 2019-10-05 DIAGNOSIS — R928 Other abnormal and inconclusive findings on diagnostic imaging of breast: Secondary | ICD-10-CM

## 2019-10-18 ENCOUNTER — Ambulatory Visit
Admission: RE | Admit: 2019-10-18 | Discharge: 2019-10-18 | Disposition: A | Discharge status: Home / Self Care | Payer: 59 | Source: Ambulatory Visit | Attending: Obstetrics and Gynecology | Admitting: Obstetrics and Gynecology

## 2019-10-18 ENCOUNTER — Ambulatory Visit: Payer: Self-pay

## 2019-10-18 ENCOUNTER — Other Ambulatory Visit: Payer: Self-pay

## 2019-10-18 DIAGNOSIS — R928 Other abnormal and inconclusive findings on diagnostic imaging of breast: Secondary | ICD-10-CM

## 2019-11-21 ENCOUNTER — Encounter: Payer: Self-pay | Admitting: Gastroenterology

## 2020-01-04 ENCOUNTER — Encounter: Payer: Self-pay | Admitting: Gastroenterology

## 2020-01-04 ENCOUNTER — Other Ambulatory Visit: Payer: Self-pay

## 2020-01-04 ENCOUNTER — Ambulatory Visit (AMBULATORY_SURGERY_CENTER): Payer: Self-pay | Admitting: *Deleted

## 2020-01-04 VITALS — Ht 64.5 in | Wt 162.0 lb

## 2020-01-04 DIAGNOSIS — Z1211 Encounter for screening for malignant neoplasm of colon: Secondary | ICD-10-CM

## 2020-01-04 MED ORDER — SUTAB 1479-225-188 MG PO TABS: 24.0000 | ORAL_TABLET | ORAL | 0 refills | Status: DC

## 2020-01-04 NOTE — Progress Notes (Signed)
No egg or soy allergy known to patient   issues with past sedation with any surgeries or procedures- PONV - with shoulder surgery increased BP  no intubation problems in the past  No FH of Malignant Hyperthermia No diet pills per patient No home 02 use per patient  No blood thinners per patient  Pt denies issues with constipation  No A fib or A flutter  EMMI video to pt or via [place] 19 guidelines implemented in PV today with Pt and RN   Fully vax'd  Pt changed Colon date in PV- she also wants egd added to Colon- OV made 12-9 with J Zehr at 930 am to discuss adding EGD to Colon - PV completed today    Sutab  Coupon given to pt in PV today , Code to Pharmacy   Due to the COVID-19 pandemic we are asking patients to follow these guidelines. Please only bring one care partner. Please be aware that your care partner may wait in the car in the parking lot or if they feel like they will be too hot to wait in the car, they may wait in the lobby on the 4th floor. All care partners are required to wear a mask the entire time (we do not have any that we can provide them), they need to practice social distancing, and we will do a Covid check for all patient's and care partners when you arrive. Also we will check their temperature and your temperature. If the care partner waits in their car they need to stay in the parking lot the entire time and we will call them on their cell phone when the patient is ready for discharge so they can bring the car to the front of the building. Also all patient's will need to wear a mask into building.

## 2020-01-18 ENCOUNTER — Encounter: Payer: 59 | Admitting: Gastroenterology

## 2020-01-25 ENCOUNTER — Ambulatory Visit: Payer: 59 | Admitting: Gastroenterology

## 2020-02-01 ENCOUNTER — Ambulatory Visit (INDEPENDENT_AMBULATORY_CARE_PROVIDER_SITE_OTHER): Payer: 59 | Admitting: Nurse Practitioner

## 2020-02-01 ENCOUNTER — Encounter: Payer: Self-pay | Admitting: Nurse Practitioner

## 2020-02-01 VITALS — BP 154/90 | HR 76 | Ht 64.5 in | Wt 161.1 lb

## 2020-02-01 DIAGNOSIS — K219 Gastro-esophageal reflux disease without esophagitis: Secondary | ICD-10-CM

## 2020-02-01 NOTE — Patient Instructions (Signed)
You have been scheduled for an endoscopy and colonoscopy. Please follow the written instructions given to you at your visit today. Please pick up your prep supplies at the pharmacy within the next 1-3 days. If you use inhalers (even only as needed), please bring them with you on the day of your procedure.   Due to recent changes in healthcare laws, you may see the results of your imaging and laboratory studies on MyChart before your provider has had a chance to review them.  We understand that in some cases there may be results that are confusing or concerning to you. Not all laboratory results come back in the same time frame and the provider may be waiting for multiple results in order to interpret others.  Please give [place] 48 hours in order for your provider to thoroughly review all the results before contacting the office for clarification of your results.   If you are age 63 or older, your body mass index should be between 23-30. Your Body mass index is 27.23 kg/m. If this is out of the aforementioned range listed, please consider follow up with your Primary Care Provider.  If you are age 1 or younger, your body mass index should be between 19-25. Your Body mass index is 27.23 kg/m. If this is out of the aformentioned range listed, please consider follow up with your Primary Care Provider.    I appreciate the  opportunity to care for you  Thank You   [place]

## 2020-02-01 NOTE — Progress Notes (Signed)
ASSESSMENT AND PLAN    # 59 yo female with chronic GERD on PPI for years. She inquires about weaning off reflux medication, has some concerns about side effects of long term treatment. However, she is symptomatic off treatment and now recently having breakthrough symptoms on treatment.  I don't know that she can switch to an H2 blocker as Pepcid caused headaches.   --She is scheduled for screening colonoscopy. Will add on an EGD for evaluation of breakthrough symptoms. The risks and benefits of EGD were discussed and the patient agrees to proceed.   --She had reflux esophagitis and a small hiatal hernia on 2011 EGD, query if she might be a candidate for TIF procedure.    HISTORY OF PRESENT ILLNESS     Primary Gastroenterologist : Harl Bowie, MD  Chief Complaint : GERD  Anita Snow is a 59 y.o. female with PMH / [place] significant for,  but not necessarily limited to: ADD, chronic GERD, hiatal hernia  Anita Snow is scheduled for her 10 year screening  colonoscopy next month. Over the last month she has had intermittent heartburn and a couple of episodes of  nocturnal regurgitation treated with Tums in addition to daily PPI which she has been on for years. . She has noticed that her upper abdomen is tender, similar to what she had several years ago. Over the last year she has occasionally been drinking wine in the evenings and thinks that could be contributing to breakthrough GERD symptoms. Also she sometimes feels like she is "choking" on her saliva. No dysphagia. She had post nasal drainage.  Recently started snoring.    Patient inquires about weaning off PPI because she has taken it for so many years   Previous Endoscopic Evaluations / Pertinent Studies:  March 2011 normal colonoscopy  Past Medical History:  Diagnosis Date  . ADHD (attention deficit hyperactivity disorder)   . Allergy   . Arthritis   . Asthma    exercised inducded asthma   . GERD (gastroesophageal reflux  disease)   . Glaucoma   . Hiatal hernia   . Mass of finger    LEFT THUMB  . Pain of left thumb   . PONV (postoperative nausea and vomiting)   . Wears glasses   . White coat syndrome without hypertension      Past Surgical History:  Procedure Laterality Date  . [place]  . COLONOSCOPY    . D & C HYSTECSCOPY W/ RESECTION POLYP  04/2015  . KNEE ARTHROSCOPY Right 2010  . MASS EXCISION Left 10/15/2015   Procedure: LEFT THUMB EXCISION MASS WITH ARTHROTOMY AND SYNOVECTOMY;  Surgeon: Roseanne Kaufman, MD;  Location: [facility];  Service: Orthopedics;  Laterality: Left;  . SHOULDER ARTHROSCOPY Left 2007   buritis  . TONSILLECTOMY AND ADENOIDECTOMY  as child   Family History  Problem Relation Age of Onset  . Heart disease Father   . Hypertension Father   . Glaucoma Father   . Arthritis Mother   . Alzheimer's disease Mother   . Thyroid disease Mother   . Hypertension Mother   . Heart disease Brother   . Glaucoma Brother   . Lung cancer Maternal Grandmother   . Breast cancer Maternal Grandmother   . COPD Maternal Grandfather   . Emphysema Maternal Grandfather   . Heart disease Paternal Grandfather   . Breast cancer Cousin        maternal cousin x 2  .  Colon cancer Neg Hx   . Colon polyps Neg Hx   . Esophageal cancer Neg Hx   . Rectal cancer Neg Hx   . Stomach cancer Neg Hx    Social History   Tobacco Use  . Smoking status: Never Smoker  . Smokeless tobacco: Never Used  Vaping Use  . Vaping Use: Never used  Substance Use Topics  . Alcohol use: Yes    Comment: SOCIAL- wine 5 times a week  . Drug use: No   Current Outpatient Medications  Medication Sig Dispense Refill  . albuterol (PROVENTIL HFA;VENTOLIN HFA) 108 (90 Base) MCG/ACT inhaler Inhale 2 puffs into the lungs every 6 (six) hours as needed for wheezing. 1 Inhaler 0  . amphetamine-dextroamphetamine (ADDERALL XR) 25 MG 24 hr capsule Take 1 capsule by mouth every morning. 30  capsule 0  . cycloSPORINE (RESTASIS) 0.05 % ophthalmic emulsion Place 1 drop into both eyes 2 (two) times daily.    Marland Kitchen esomeprazole (NEXIUM) 20 MG capsule TAKE 1 CAPSULE DAILY (Patient taking differently: Take 20 mg by mouth every morning. TAKE 1 CAPSULE DAILY) 90 capsule 3  . fluticasone (FLONASE) 50 MCG/ACT nasal spray Place 2 sprays into the nose daily. 16 g 5  . ibuprofen (ADVIL,MOTRIN) 600 MG tablet Take 1 tablet (600 mg total) by mouth every 8 (eight) hours as needed. (Patient taking differently: Take 600 mg by mouth as needed.) 30 tablet 0  . IMVEXXY MAINTENANCE PACK 10 MCG INST SMARTSIG:1 Insert Vaginal Twice a Week    . latanoprost (XALATAN) 0.005 % ophthalmic solution SMARTSIG:1 Drop(s) In Eye(s) Every Evening    . progesterone (PROMETRIUM) 100 MG capsule Take 200 mg by mouth at bedtime.    . TESTOSTERONE PROPIONATE TD Compounded Testosterone Cream 4%  Testosterone Proprionate 4mg /gm in gel or cream base  apply to inner thigh at bedtime nightly    . valACYclovir (VALTREX) 500 MG tablet Take 500 mg by mouth daily.    Marland Kitchen AUVI-Q 0.3 MG/0.3ML SOAJ injection Inject into the muscle. (Patient not taking: Reported on 02/01/2020)     No current facility-administered medications for this visit.   Allergies  Allergen Reactions  . Cephalosporins Swelling  . Shellfish Allergy Swelling    Lip swells and hands swell  8 to 10 hours after being around it     Review of Systems: Positive for allergy / sinus trouble, arthritis, night sweats and snoring.  All other systems reviewed and negative except where noted in HPI.   PHYSICAL EXAM :    Wt Readings from Last 3 Encounters:  02/01/20 161 lb 2 oz (73.1 kg)  01/04/20 162 lb (73.5 kg)  06/01/17 150 lb (68 kg)    Ht 5' 4.5" (1.638 m) Comment: height measured without shoes  Wt 161 lb 2 oz (73.1 kg)   LMP 11/13/2012   BMI 27.23 kg/m  Constitutional:  Pleasant female in no acute distress. Psychiatric: Normal mood and affect. Behavior is  normal. EENT: Pupils normal.  Conjunctivae are normal. No scleral icterus. Neck supple.  Cardiovascular: Normal rate, regular rhythm. No edema Pulmonary/chest: Effort normal and breath sounds normal. No wheezing, rales or rhonchi. Abdominal: Soft, nondistended, nontender. Bowel sounds active throughout. There are no masses palpable. No hepatomegaly. Neurological: Alert and oriented to person place and time. Skin: Skin is warm and dry. No rashes noted.  Tye Savoy, NP  02/01/2020, 11:18 AM  Cc:   Bernerd Limbo, MD

## 2020-02-29 ENCOUNTER — Encounter: Payer: 59 | Admitting: Gastroenterology

## 2020-03-25 ENCOUNTER — Encounter: Payer: Self-pay | Admitting: Gastroenterology

## 2020-03-27 ENCOUNTER — Other Ambulatory Visit: Payer: Self-pay

## 2020-03-27 ENCOUNTER — Telehealth: Payer: Self-pay

## 2020-03-27 ENCOUNTER — Ambulatory Visit (AMBULATORY_SURGERY_CENTER): Payer: BC Managed Care – PPO | Admitting: Gastroenterology

## 2020-03-27 ENCOUNTER — Encounter: Payer: Self-pay | Admitting: Gastroenterology

## 2020-03-27 VITALS — BP 158/89 | HR 80 | Temp 98.0°F | Resp 18 | Ht 64.5 in | Wt 161.0 lb

## 2020-03-27 DIAGNOSIS — Z1211 Encounter for screening for malignant neoplasm of colon: Secondary | ICD-10-CM

## 2020-03-27 DIAGNOSIS — K449 Diaphragmatic hernia without obstruction or gangrene: Secondary | ICD-10-CM

## 2020-03-27 DIAGNOSIS — K2270 Barrett's esophagus without dysplasia: Secondary | ICD-10-CM

## 2020-03-27 DIAGNOSIS — D123 Benign neoplasm of transverse colon: Secondary | ICD-10-CM | POA: Diagnosis not present

## 2020-03-27 DIAGNOSIS — D122 Benign neoplasm of ascending colon: Secondary | ICD-10-CM | POA: Diagnosis not present

## 2020-03-27 DIAGNOSIS — K219 Gastro-esophageal reflux disease without esophagitis: Secondary | ICD-10-CM | POA: Diagnosis present

## 2020-03-27 DIAGNOSIS — D128 Benign neoplasm of rectum: Secondary | ICD-10-CM

## 2020-03-27 MED ORDER — SODIUM CHLORIDE 0.9 % IV SOLN: 500.0000 mL | Freq: Once | INTRAVENOUS | Status: DC

## 2020-03-27 MED ORDER — ESOMEPRAZOLE MAGNESIUM 20 MG PO CPDR: 20.0000 mg | DELAYED_RELEASE_CAPSULE | Freq: Every day | ORAL | 3 refills | Status: DC

## 2020-03-27 NOTE — Progress Notes (Signed)
Pt's states no medical or surgical changes since previsit or office visit.   VS taken by CW 

## 2020-03-27 NOTE — Progress Notes (Signed)
PT taken to PACU. Monitors in place. VSS. Report given to RN. 

## 2020-03-27 NOTE — Patient Instructions (Signed)
Resume previous diet Continue current medications Start using pantoprazole 40mg  by mouth daily take only with water and at least 30 minutes before you eat Please return to GI office in 2 months, call for appt  YOU HAD AN ENDOSCOPIC PROCEDURE TODAY AT [facility]:   Refer to the procedure report that was given to you for any specific questions about what was found during the examination.  If the procedure report does not answer your questions, please call your gastroenterologist to clarify.  If you requested that your care partner not be given the details of your procedure findings, then the procedure report has been included in a sealed envelope for you to review at your convenience later.  YOU SHOULD EXPECT: Some feelings of bloating in the abdomen. Passage of more gas than usual.  Walking can help get rid of the air that was put into your GI tract during the procedure and reduce the bloating. If you had a lower endoscopy (such as a colonoscopy or flexible sigmoidoscopy) you may notice spotting of blood in your stool or on the toilet paper. If you underwent a bowel prep for your procedure, you may not have a normal bowel movement for a few days.  Please Note:  You might notice some irritation and congestion in your nose or some drainage.  This is from the oxygen used during your procedure.  There is no need for concern and it should clear up in a day or so.  SYMPTOMS TO REPORT IMMEDIATELY:   Following lower endoscopy (colonoscopy or flexible sigmoidoscopy):  Excessive amounts of blood in the stool  Significant tenderness or worsening of abdominal pains  Swelling of the abdomen that is new, acute  Fever of 100F or higher   Following upper endoscopy (EGD)  Vomiting of blood or coffee ground material  New chest pain or pain under the shoulder blades  Painful or persistently difficult swallowing  New shortness of breath  Fever of 100F or higher  Black, tarry-looking  stools  For urgent or emergent issues, a gastroenterologist can be reached at any hour by calling [phone]. Do not use MyChart messaging for urgent concerns.   DIET:  We do recommend a small meal at first, but then you may proceed to your regular diet.  Drink plenty of fluids but you should avoid alcoholic beverages for 24 hours.  ACTIVITY:  You should plan to take it easy for the rest of today and you should NOT DRIVE or use heavy machinery until tomorrow (because of the sedation medicines used during the test).    FOLLOW UP: Our staff will call the number listed on your records 48-72 hours following your procedure to check on you and address any questions or concerns that you may have regarding the information given to you following your procedure. If we do not reach you, we will leave a message.  We will attempt to reach you two times.  During this call, we will ask if you have developed any symptoms of COVID 19. If you develop any symptoms (ie: fever, flu-like symptoms, shortness of breath, cough etc.) before then, please call [phone].  If you test positive for Covid 19 in the 2 weeks post procedure, please call and report this information to [place].    If any biopsies were taken you will be contacted by phone or by letter within the next 1-3 weeks.  Please call us at [phone] if you have not heard about the biopsies in  3 weeks.   SIGNATURES/CONFIDENTIALITY: You and/or your care partner have signed paperwork which will be entered into your electronic medical record.  These signatures attest to the fact that that the information above on your After Visit Summary has been reviewed and is understood.  Full responsibility of the confidentiality of this discharge information lies with you and/or your care-partner.

## 2020-03-27 NOTE — Op Note (Addendum)
[place] Patient Name: Anita Snow Procedure Date: 03/27/2020 9:25 AM MRN: [id number] Endoscopist: Mauri Pole , MD Age: 60 Referring MD:  Date of Birth: 10-13-1960 Gender: Female Account #: [id number] Procedure:                Upper GI endoscopy Indications:              Dyspepsia, Esophageal reflux symptoms that recur                            despite appropriate therapy Medicines:                Monitored Anesthesia Care Procedure:                Pre-Anesthesia Assessment:                           - Prior to the procedure, a History and Physical                            was performed, and patient medications and                            allergies were reviewed. The patient's tolerance of                            previous anesthesia was also reviewed. The risks                            and benefits of the procedure and the sedation                            options and risks were discussed with the patient.                            All questions were answered, and informed consent                            was obtained. Prior Anticoagulants: The patient has                            taken no previous anticoagulant or antiplatelet                            agents. ASA Grade Assessment: II - A patient with                            mild systemic disease. After reviewing the risks                            and benefits, the patient was deemed in                            satisfactory condition to undergo the procedure.  After obtaining informed consent, the endoscope was                            passed under direct vision. Throughout the                            procedure, the patient's blood pressure, pulse, and                            oxygen saturations were monitored continuously. The                            Endoscope was introduced through the mouth, and                            advanced to the second part  of duodenum. The upper                            GI endoscopy was accomplished without difficulty.                            The patient tolerated the procedure well. Scope In: Scope Out: Findings:                 LA Grade B (one or more mucosal breaks greater than                            5 mm, not extending between the tops of two mucosal                            folds) esophagitis with no bleeding was found 37 to                            38 cm from the incisors.                           There were esophageal mucosal changes suspicious                            for short-segment Barrett's esophagus present in                            the distal esophagus. The maximum longitudinal                            extent of these mucosal changes was 2 cm in length.                            Mucosa was biopsied with a cold forceps for                            histology randomly at intervals of 1 cm in the  lower third of the esophagus and at the                            gastroesophageal junction. One specimen bottle was                            sent to pathology.                           The gastroesophageal flap valve was visualized                            endoscopically and classified as Hill Grade III                            (minimal fold, loose to endoscope, hiatal hernia                            likely).                           The stomach was normal.                           The examined duodenum was normal. Complications:            No immediate complications. Estimated Blood Loss:     Estimated blood loss was minimal. Impression:               - LA Grade B reflux esophagitis with no bleeding.                           - Esophageal mucosal changes suspicious for                            short-segment Barrett's esophagus. Biopsied.                           - Gastroesophageal flap valve classified as Hill                             Grade III (minimal fold, loose to endoscope, hiatal                            hernia likely).                           - Normal stomach.                           - Normal examined duodenum. Recommendation:           - Patient has a contact number available for                            emergencies. The signs and symptoms of potential  delayed complications were discussed with the                            patient. Return to normal activities tomorrow.                            Written discharge instructions were provided to the                            patient.                           - Resume previous diet.                           - Continue present medications.                           - Await pathology results.                           - Use Nexium 40 mg PO daily.                           - Return to GI office in 2 months. Please call to                            schedule follow up visit. Mauri Pole, MD 03/27/2020 10:38:47 AM This report has been signed electronically.

## 2020-03-27 NOTE — Progress Notes (Signed)
Called to room to assist during endoscopic procedure.  Patient ID and intended procedure confirmed with present staff. Received instructions for my participation in the procedure from the performing physician.  

## 2020-03-27 NOTE — Op Note (Signed)
[place] Patient Name: Anita Snow Procedure Date: 03/27/2020 9:24 AM MRN: [id number] Endoscopist: Mauri Pole , MD Age: 60 Referring MD:  Date of Birth: 1960-06-08 Gender: Female Account #: [id number] Procedure:                Colonoscopy Indications:              Screening for colorectal malignant neoplasm Medicines:                Monitored Anesthesia Care Procedure:                Pre-Anesthesia Assessment:                           - Prior to the procedure, a History and Physical                            was performed, and patient medications and                            allergies were reviewed. The patient's tolerance of                            previous anesthesia was also reviewed. The risks                            and benefits of the procedure and the sedation                            options and risks were discussed with the patient.                            All questions were answered, and informed consent                            was obtained. Prior Anticoagulants: The patient has                            taken no previous anticoagulant or antiplatelet                            agents. ASA Grade Assessment: II - A patient with                            mild systemic disease. After reviewing the risks                            and benefits, the patient was deemed in                            satisfactory condition to undergo the procedure.                           After obtaining informed consent, the colonoscope  was passed under direct vision. Throughout the                            procedure, the patient's blood pressure, pulse, and                            oxygen saturations were monitored continuously. The                            Olympus PCF-H190DL ([id number]) Colonoscope was                            introduced through the anus and advanced to the the                            cecum,  identified by appendiceal orifice and                            ileocecal valve. The colonoscopy was performed                            without difficulty. The patient tolerated the                            procedure well. The quality of the bowel                            preparation was excellent. The ileocecal valve,                            appendiceal orifice, and rectum were photographed. Scope In: 10:01:01 AM Scope Out: 10:24:47 AM Scope Withdrawal Time: 0 hours 11 minutes 52 seconds  Total Procedure Duration: 0 hours 23 minutes 46 seconds  Findings:                 The perianal and digital rectal examinations were                            normal.                           Four sessile polyps were found in the rectum,                            transverse colon and ascending colon. The polyps                            were 4 to 12 mm in size. These polyps were removed                            with a cold snare. Resection and retrieval were                            complete.  Scattered small and large-mouthed diverticula were                            found in the sigmoid colon, descending colon and                            ascending colon.                           Non-bleeding internal hemorrhoids were found during                            retroflexion. The hemorrhoids were small. Complications:            No immediate complications. Estimated Blood Loss:     Estimated blood loss was minimal. Impression:               - Four 4 to 12 mm polyps in the rectum, in the                            transverse colon and in the ascending colon,                            removed with a cold snare. Resected and retrieved.                           - Diverticulosis in the sigmoid colon, in the                            descending colon and in the ascending colon.                           - Non-bleeding internal hemorrhoids. Recommendation:            - Patient has a contact number available for                            emergencies. The signs and symptoms of potential                            delayed complications were discussed with the                            patient. Return to normal activities tomorrow.                            Written discharge instructions were provided to the                            patient.                           - Resume previous diet.                           - Continue present medications.                           -  Await pathology results.                           - Repeat colonoscopy in 3 - 5 years for                            surveillance based on pathology results. Mauri Pole, MD 03/27/2020 10:35:35 AM This report has been signed electronically.

## 2020-03-27 NOTE — Telephone Encounter (Signed)
I have started a prior authorization thru [employer] [place] for patient's esomeprazole magnesium 20mg  for patients GERD K21.9 will await the outcome.

## 2020-03-28 NOTE — Telephone Encounter (Signed)
Her esomeprazole magnesium 20mg  capsules have been denied. She doesn't have drug coverage for over the counter drugs. Please advise what you would like her to do. Thank you.

## 2020-03-29 ENCOUNTER — Telehealth: Payer: Self-pay

## 2020-03-29 NOTE — Telephone Encounter (Signed)
Left message on follow up call. 

## 2020-03-29 NOTE — Telephone Encounter (Signed)
  Follow up Call-  Call back number 03/27/2020  Post procedure Call Back phone  # [phone]  Permission to leave phone message Yes  Some recent data might be hidden     Patient questions:  Do you have a fever, pain , or abdominal swelling? No. Pain Score  0 *  Have you tolerated food without any problems? Yes.    Have you been able to return to your normal activities? Yes.    Do you have any questions about your discharge instructions: Diet   No. Medications  No. Follow up visit  No.  Do you have questions or concerns about your Care? No.  Actions: * If pain score is 4 or above: No action needed, pain <4.  1. Have you developed a fever since your procedure? no  2.   Have you had an respiratory symptoms (SOB or cough) since your procedure? no  3.   Have you tested positive for COVID 19 since your procedure no  4.   Have you had any family members/close contacts diagnosed with the COVID 19 since your procedure?  no   If yes to any of these questions please route to Joylene John, RN and Joella Prince, RN

## 2020-04-02 ENCOUNTER — Encounter: Payer: Self-pay | Admitting: Gastroenterology

## 2020-04-02 NOTE — Telephone Encounter (Signed)
ok 

## 2020-04-02 NOTE — Telephone Encounter (Signed)
I called Satcha back and she said she is just taking the over the counter brand name Nexium for now. Generic's tend to give her a headache. She said she will discuss more options at her next visit with you.   She also wanted you to know that she tested Positive for covid this AM. You scoped her on 03/27/2020 and she started with symptoms on 03/31/2020. Her and her husband both have it. They have had all their covid vaccines and booster shot as well.

## 2020-04-15 NOTE — Progress Notes (Signed)
Reviewed and agree with documentation and assessment and plan. K. Veena Alecia Doi , MD   

## 2020-11-08 IMAGING — MG MM DIGITAL DIAGNOSTIC UNILAT*L* W/ TOMO W/ CAD
4 series · 4 of 12 positions shown · non-contrast
Comparison: Previous exam(s).

CLINICAL DATA: Possible mass in the central left breast on a recent
screening mammogram.

EXAM:
DIGITAL DIAGNOSTIC UNILATERAL LEFT MAMMOGRAM WITH TOMO AND CAD

[L MLO synth-2D]
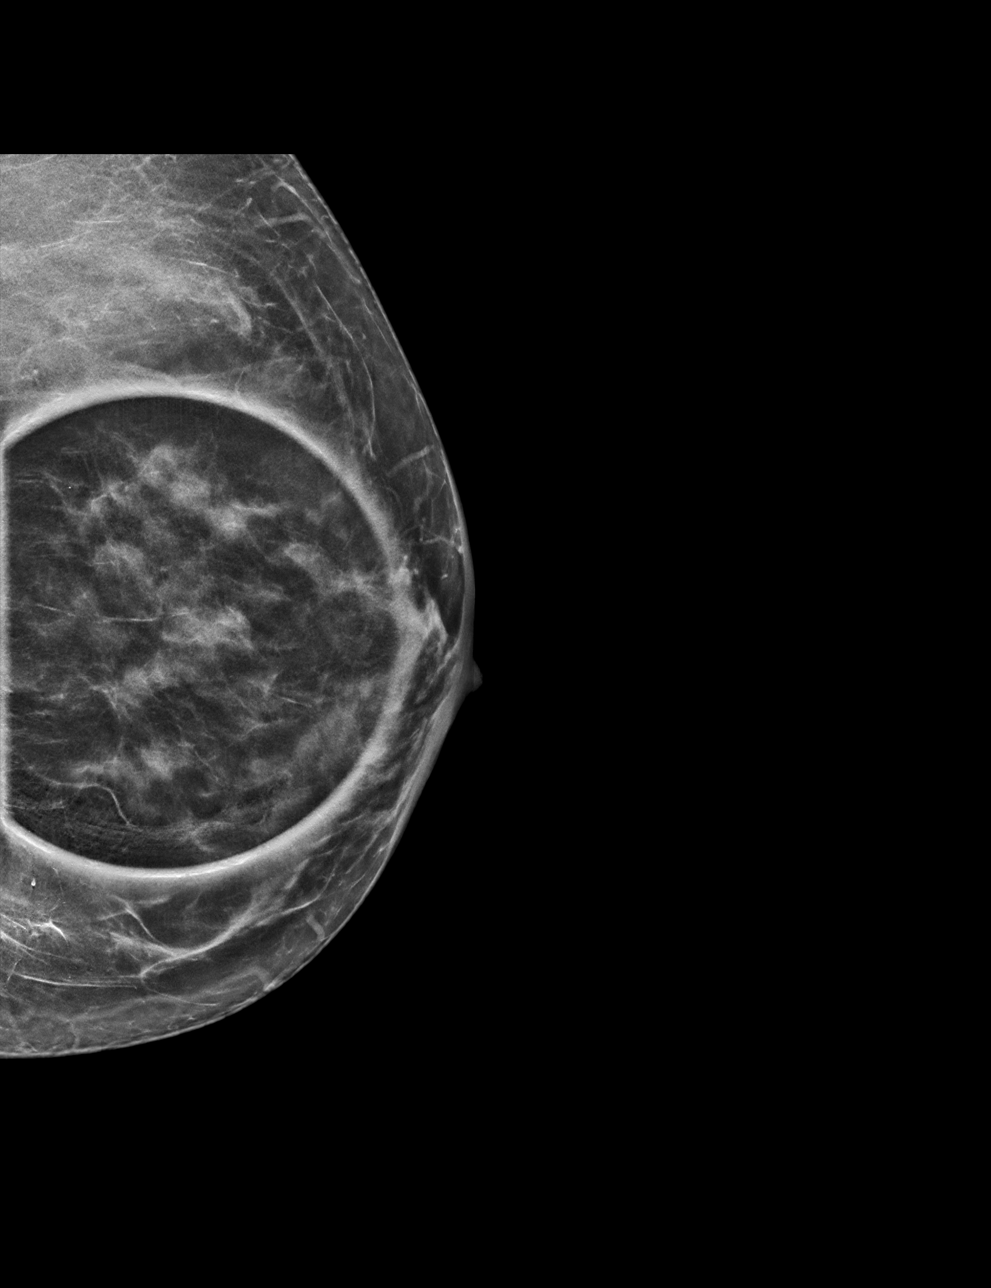

[L CC synth-2D]
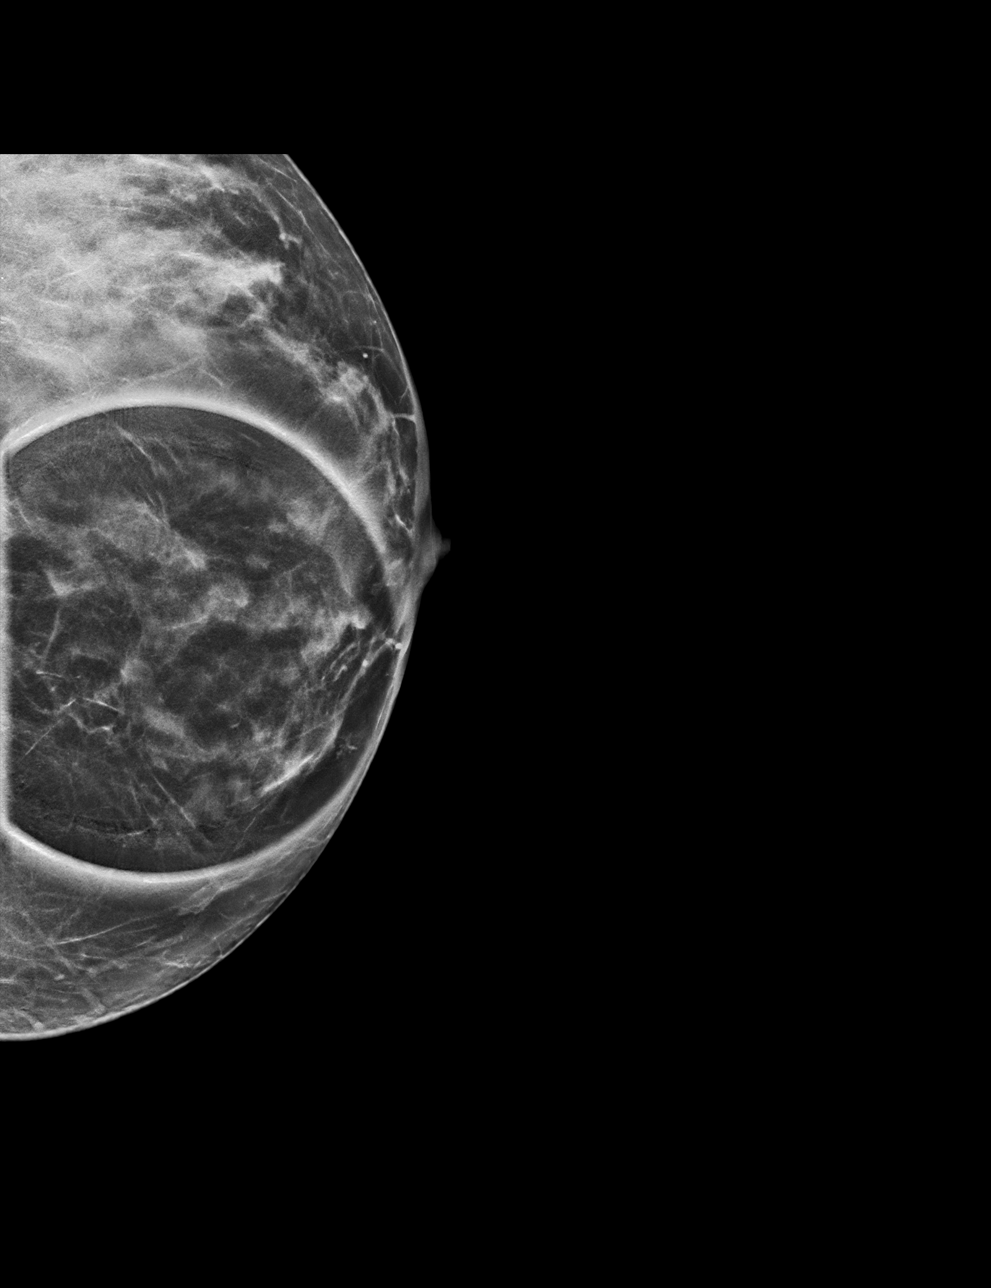

[L MLO tomo · tomo slice 29/56.0]
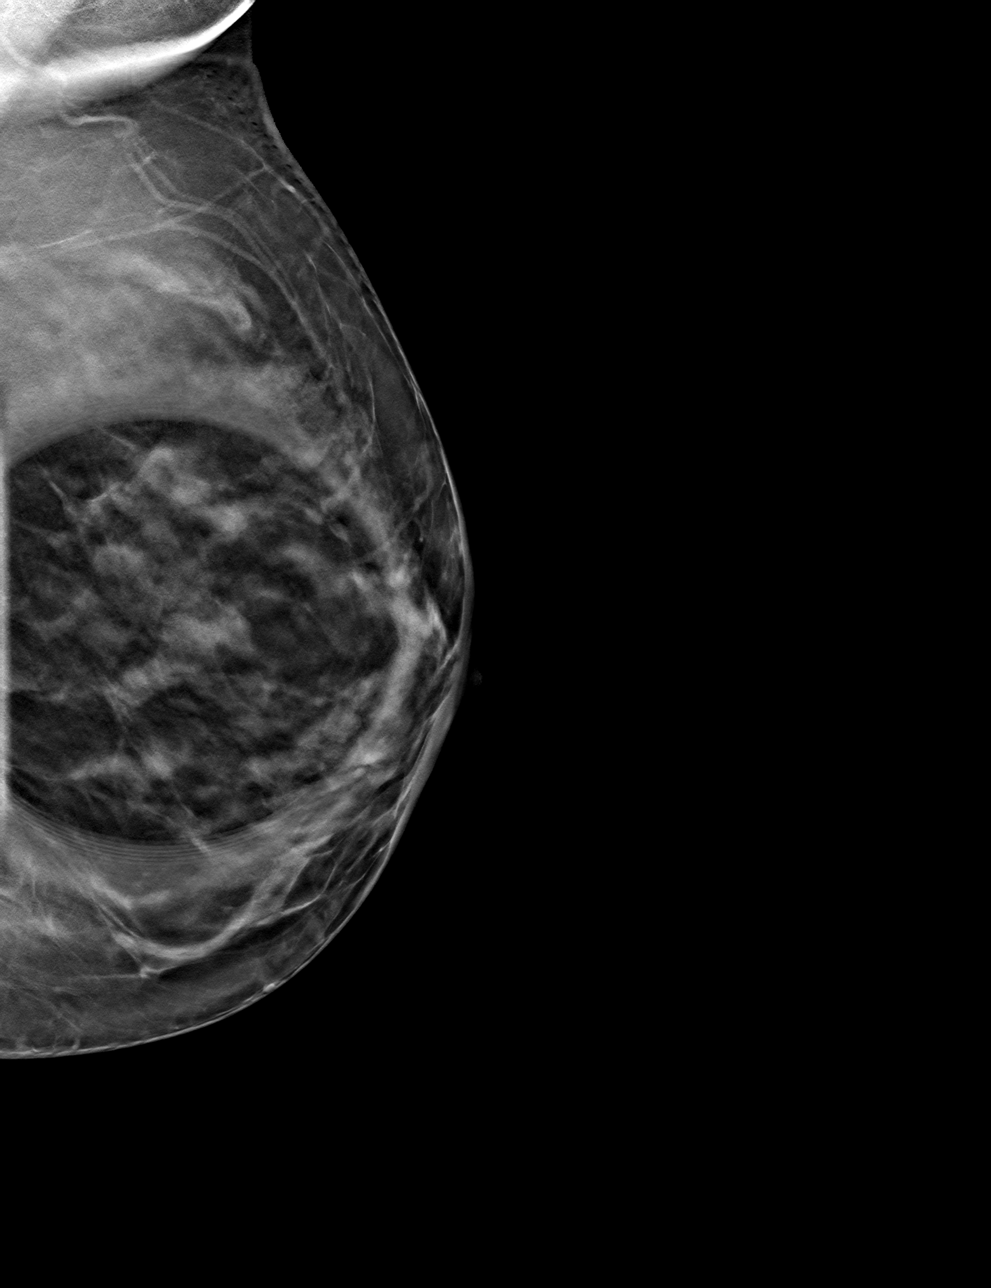

[L CC tomo · tomo slice 25/50.0]
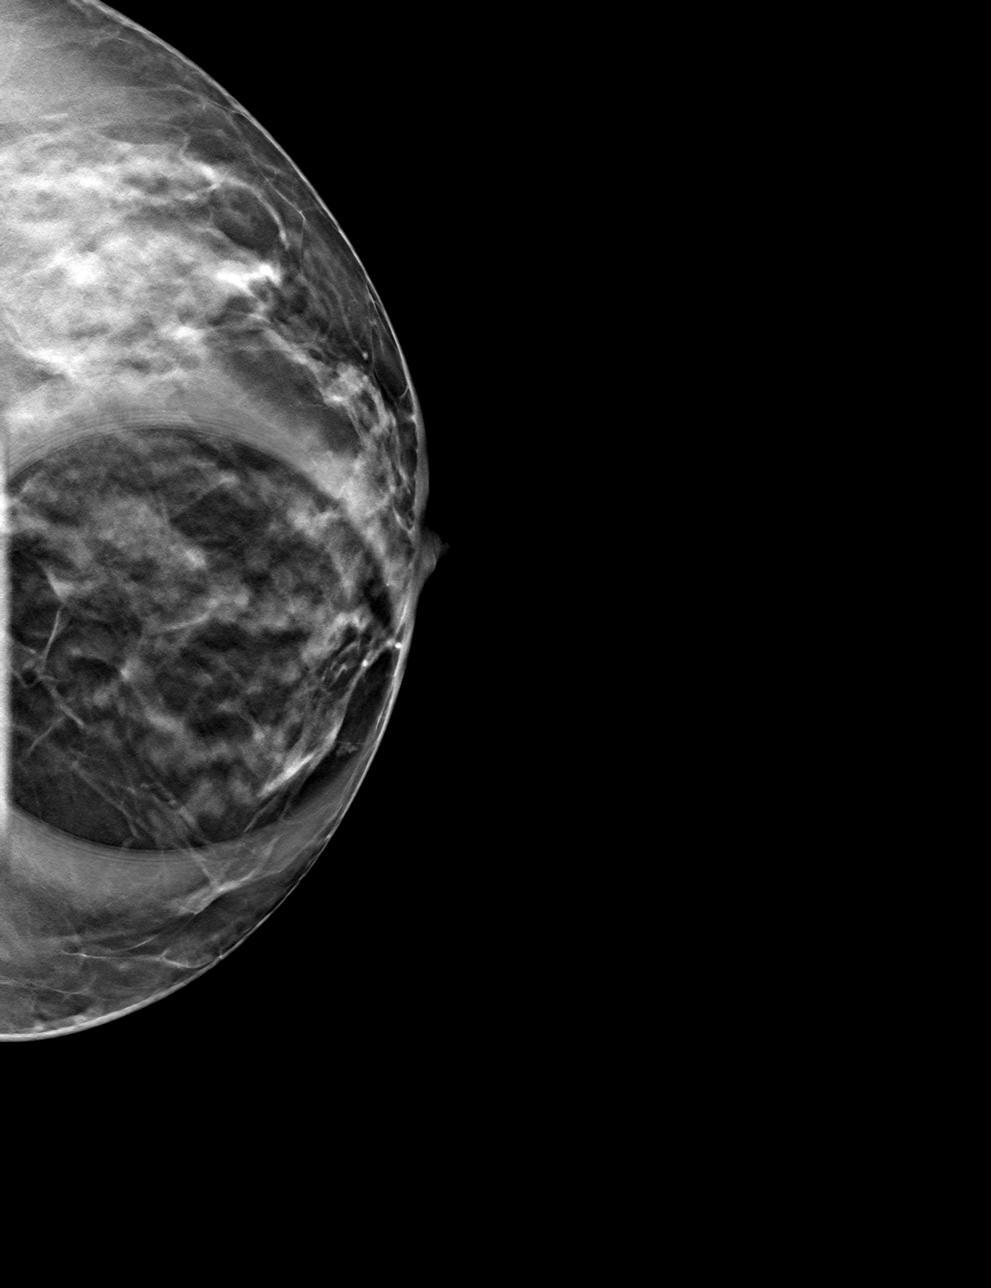

[4 of 12 positions shown; findings below may reference images not displayed]

ACR Breast Density Category c: The breast tissue is heterogeneously
dense, which may obscure small masses.
FINDINGS: 3D tomographic and 2D generated spot compression views of the left
breast demonstrate normal appearing fibroglandular tissue at the
location of the recently suspected mass in the central left breast.

Mammographic images were processed with CAD.
IMPRESSION: No evidence of malignancy. The recently suspected left breast mass
was close apposition of normal breast tissue.

RECOMMENDATION:
Bilateral screening mammogram in 1 year.

I have discussed the findings and recommendations with the patient.
If applicable, a reminder letter will be sent to the patient
regarding the next appointment.

BI-RADS CATEGORY  1: Negative.

## 2021-01-14 ENCOUNTER — Ambulatory Visit (HOSPITAL_BASED_OUTPATIENT_CLINIC_OR_DEPARTMENT_OTHER): Payer: BC Managed Care – PPO | Attending: Orthopedic Surgery | Admitting: Physical Therapy

## 2021-01-14 ENCOUNTER — Other Ambulatory Visit: Payer: Self-pay

## 2021-01-14 DIAGNOSIS — M79601 Pain in right arm: Secondary | ICD-10-CM | POA: Insufficient documentation

## 2021-01-14 DIAGNOSIS — M25541 Pain in joints of right hand: Secondary | ICD-10-CM | POA: Diagnosis present

## 2021-01-14 NOTE — Therapy (Signed)
OUTPATIENT PHYSICAL THERAPY SHOULDER EVALUATION   Patient Name: Anita Snow MRN: [id number] DOB:September 10, 1960, 60 y.o., female Today's Date: 01/15/2021   PT End of Session - 01/15/21 0936     Visit Number 1    Number of Visits 12    Date for PT Re-Evaluation 02/26/21    PT Start Time 0315    PT Stop Time 0359    PT Time Calculation (min) 44 min    Activity Tolerance Patient tolerated treatment well    Behavior During Therapy WFL for tasks assessed/performed             Past Medical History:  Diagnosis Date   ADHD (attention deficit hyperactivity disorder)    Allergy    Arthritis    Asthma    exercised inducded asthma    GERD (gastroesophageal reflux disease)    Glaucoma    Hiatal hernia    Mass of finger    LEFT THUMB   Pain of left thumb    PONV (postoperative nausea and vomiting)    Wears glasses    White coat syndrome without hypertension    Past Surgical History:  Procedure Laterality Date   [place]     [place] & [place] HYSTECSCOPY W/ RESECTION POLYP  04/2015   KNEE ARTHROSCOPY Right 2010   MASS EXCISION Left 10/15/2015   Procedure: LEFT THUMB EXCISION MASS WITH ARTHROTOMY AND SYNOVECTOMY;  Surgeon: Roseanne Kaufman, MD;  Location: [facility];  Service: Orthopedics;  Laterality: Left;   SHOULDER ARTHROSCOPY Left 2007   buritis   TONSILLECTOMY AND ADENOIDECTOMY  as child   Patient Active Problem List   Diagnosis Date Noted   Glaucoma 05/25/2017   Hyperlipidemia 11/12/2015   Generalized abdominal pain 01/20/2015   Bloating 01/20/2015   Nausea with vomiting 01/20/2015   Diarrhea 01/20/2015   Menopausal and perimenopausal disorder 2014 11/23/2012   ADD (attention deficit disorder) 08/05/2011   AR (allergic rhinitis) 08/05/2011   DDD (degenerative disc disease), cervical 08/05/2011   Osteoarthritis, shoulder 08/05/2011   Osteopenia 08/05/2011   ESOPHAGEAL REFLUX 03/26/2009   OSTEOARTHRITIS 03/26/2009    PCP:  Bernerd Limbo, MD  REFERRING PROVIDER: Roseanne Kaufman, MD  REFERRING DIAG: right forearm pain/ right thumb pain  THERAPY DIAG:  right forearm pain/ right thumb pain  ONSET DATE: Forearm Pain: Last 3 months  Thumb: 5 years  SUBJECTIVE:                                                                                                                                                                                      SUBJECTIVE STATEMENT: Patient has  a long history bilateral thumb pain. Her right thumb needs a replacement per MD. Over the past few months she has developed right forearm pain. She has increased pain with activity. 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 Her pain is mostly lateral, but can be medial as well. She is also having some shoulder pain as well in her anterior and posterior shoulder    PERTINENT HISTORY: Right knee scope; Left thumb clean out  Hypertension;   PAIN:  Are you having pain? Yes VAS scale: 4/10 can get up higher with movement  Pain location: right medial forearm  Pain orientation: Right  PAIN TYPE: aching Pain description: constant fluctuates though  Aggravating factors: turning the arm;  Relieving factors: not overusing the elbows   PAIN:  Are you having pain? Yes VAS scale: 2/10 Pain location: Right CMC  Pain orientation: Right  PAIN TYPE: aching Pain description: intermittent  Aggravating factors: use of the thumb  Relieving factors: rest   PATIENT EDUCATION:    PRECAUTIONS: None  WEIGHT BEARING RESTRICTIONS No  FALLS:  Has patient fallen in last 6 months? No Number of falls:   LIVING ENVIRONMENT: Nothing pertinent   PLOF: Independent  Hobbies: Walking/ doing Probation officer   PATIENT GOALS : to have less pain and to return to the gym OBJECTIVE:   DIAGNOSTIC FINDINGS:  Has had x-rays: but not in the computer   PATIENT SURVEYS:  FOTO    COGNITION:  Overall cognitive status: Within functional limits for tasks  assessed     SENSATION:  Light touch: Appears intact  Stereognosis: Appears intact  Hot/Cold: Appears intact  Proprioception: Appears intact  PALPATION: tenderness to palpation in the lateral forearm down following the ECRB; tendrness to palpation in the anterior shoulder and spots in the posterior shoulder    POSTURE: Good posture   UPPER EXTREMITY AROM/PROM:  AROM Right 01/15/2021 Left 01/15/2021  Shoulder flexion    Shoulder extension    Shoulder abduction    Shoulder adduction    Shoulder extension    Shoulder internal rotation    Shoulder external rotation    Elbow flexion    Elbow extension    Wrist flexion    Wrist extension -5 from neutral    Wrist ulnar deviation    Wrist radial deviation    Wrist pronation    Wrist supination    (Blank rows = not tested)    UPPER EXTREMITY MMT:  MMT Right 01/15/2021 Left 01/15/2021  Shoulder flexion 4+/5  5/5  Shoulder extension    Shoulder abduction    Shoulder adduction    Shoulder extension    Middle trapezius    Lower trapezius    Elbow flexion    Elbow extension    Wrist flexion    Wrist extension    Wrist ulnar deviation    Wrist radial deviation    Wrist pronation    Wrist supination    Grip strength    (Blank rows = not tested) Left hand: 30  Right hand 25        TODAY'S TREATMENT:  Reviewed self IASTYM using coco butter  Seated Wrist Flexion Stretch - 1 x daily - 7 x weekly - 3 sets - 3 reps - 10 hold Seated Wrist Extension Stretch - 1 x daily - 7 x weekly - 3 sets - 3 reps - 10sec hold Seated Wrist Flexion with Dumbbell - 1 x daily - 7 x weekly - 3 sets - 10  reps Seated Wrist Extension with Dumbbell - 1 x daily - 7 x weekly - 3 sets - 10 reps  PATIENT EDUCATION: Education details: reviewed symptom management progressive strengthening while keeping irritation to a minumum Person educated: Patient Education method: Explanation, Demonstration, Tactile cues, Verbal cues, and  Handouts Education comprehension: verbalized understanding, returned demonstration, and needs further education   HOME EXERCISE PROGRAM: Access Code: [email or web address] URL: [email or web address] Date: 01/15/2021 Prepared by: Carolyne Littles  Program Notes using the handle of the butter knife run it down the length of the muscle   Exercises Seated Wrist Flexion Stretch - 1 x daily - 7 x weekly - 3 sets - 3 reps - 10 hold Seated Wrist Extension Stretch - 1 x daily - 7 x weekly - 3 sets - 3 reps - 10sec hold Seated Wrist Flexion with Dumbbell - 1 x daily - 7 x weekly - 3 sets - 10 reps Seated Wrist Extension with Dumbbell - 1 x daily - 7 x weekly - 3 sets - 10 reps   ASSESSMENT:  CLINICAL IMPRESSION: Patient is a 60 year old female who presents with right thumb and right forearm pain. Her pain is exacerbated with use of the forearm. She has a mild limitation in wrist extension but a limitation that is likely exacerbated by certain yoga positions. She has mild grip weakness on the right compared to left. She feels like it is starting to effect her ability to write. 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 These impairments are limiting patient from cleaning, meal prep, occupation, and yard work. Personal factors including 1-2 comorbidities: bilateral thumb OA;   are also affecting patient's functional outcome. Patient will benefit from skilled PT to address above impairments and improve overall function.  REHAB POTENTIAL: Good  CLINICAL DECISION MAKING: Evolving/moderate complexity evolving pain in her forearm and right thumb   EVALUATION COMPLEXITY: Moderate   GOALS: Goals reviewed with patient? No  SHORT TERM  GOALS:  STG Name Target Date Goal status  1 Patient will increase right grip strength by 5 lbs  Baseline:  02/05/2021 INITIAL  2 Patient will increase right wrist extension by 5 degrees  Baseline:  02/05/2021 INITIAL  3 Patient will report a 50% reduction in right thumb and wrist pain  Baseline: 02/05/2021 INITIAL  LONG TERM GOALS:   LTG Name Target Date Goal status  1 Patient will perform down dog in yoga without pain  Baseline: 02/26/2021 INITIAL  2 Patient will write and perform fine motor tasks without pain  Baseline: 02/26/2021 INITIAL  3 Patient will be independent with complete program for strengthening Baseline: 02/26/2021 INITIAL   PLAN: PT FREQUENCY: 2x/week  PT DURATION: 6 weeks  PLANNED INTERVENTIONS: Therapeutic exercises, Therapeutic activity, Neuro Muscular re-education, Balance training, Gait training, Patient/Family education, Joint mobilization, Aquatic Therapy, Dry Needling, Cryotherapy, Moist heat, and Splintting taping;   PLAN FOR NEXT SESSION: present trigger point dry needling to the patient ( no time on eval) if open needle ECRB and thenar. Manual therapy to medial and lateral epicondyle;    Carney Living PT DPT  01/15/2021, 12:52 PM

## 2021-02-05 ENCOUNTER — Encounter (HOSPITAL_BASED_OUTPATIENT_CLINIC_OR_DEPARTMENT_OTHER): Payer: Self-pay | Admitting: Physical Therapy

## 2021-02-05 ENCOUNTER — Ambulatory Visit (HOSPITAL_BASED_OUTPATIENT_CLINIC_OR_DEPARTMENT_OTHER): Payer: BC Managed Care – PPO | Attending: Orthopedic Surgery | Admitting: Physical Therapy

## 2021-02-05 ENCOUNTER — Other Ambulatory Visit: Payer: Self-pay

## 2021-02-05 DIAGNOSIS — M79601 Pain in right arm: Secondary | ICD-10-CM | POA: Diagnosis not present

## 2021-02-05 DIAGNOSIS — M25541 Pain in joints of right hand: Secondary | ICD-10-CM | POA: Diagnosis present

## 2021-02-05 NOTE — Therapy (Signed)
OUTPATIENT PHYSICAL THERAPY TREATMENT NOTE   Patient Name: Anita Snow MRN: [id number] DOB:08-17-1960, 60 y.o., female Today's Date: 02/06/2021  PCP: Bernerd Limbo, MD REFERRING PROVIDER: Bernerd Limbo, MD   PT End of Session - 02/05/21 1312     Visit Number 2    Number of Visits 12    Date for PT Re-Evaluation 02/26/21    PT Start Time 1303    PT Stop Time 7680    PT Time Calculation (min) 42 min    Activity Tolerance Patient tolerated treatment well    Behavior During Therapy WFL for tasks assessed/performed             Past Medical History:  Diagnosis Date   ADHD (attention deficit hyperactivity disorder)    Allergy    Arthritis    Asthma    exercised inducded asthma    GERD (gastroesophageal reflux disease)    Glaucoma    Hiatal hernia    Mass of finger    LEFT THUMB   Pain of left thumb    PONV (postoperative nausea and vomiting)    Wears glasses    White coat syndrome without hypertension    Past Surgical History:  Procedure Laterality Date   [place]  04/2015   KNEE ARTHROSCOPY Right 2010   MASS EXCISION Left 10/15/2015   Procedure: LEFT THUMB EXCISION MASS WITH ARTHROTOMY AND SYNOVECTOMY;  Surgeon: Roseanne Kaufman, MD;  Location: [facility];  Service: Orthopedics;  Laterality: Left;   SHOULDER ARTHROSCOPY Left 2007   buritis   TONSILLECTOMY AND ADENOIDECTOMY  as child   Patient Active Problem List   Diagnosis Date Noted   Glaucoma 05/25/2017   Hyperlipidemia 11/12/2015   Generalized abdominal pain 01/20/2015   Bloating 01/20/2015   Nausea with vomiting 01/20/2015   Diarrhea 01/20/2015   Menopausal and perimenopausal disorder 2014 11/23/2012   ADD (attention deficit disorder) 08/05/2011   AR (allergic rhinitis) 08/05/2011   DDD (degenerative disc disease), cervical 08/05/2011   Osteoarthritis, shoulder 08/05/2011   Osteopenia 08/05/2011    ESOPHAGEAL REFLUX 03/26/2009   OSTEOARTHRITIS 03/26/2009    PCP: Bernerd Limbo, MD   REFERRING PROVIDER: Roseanne Kaufman, MD   REFERRING DIAG: right forearm pain/ right thumb pain   THERAPY DIAG:  right forearm pain/ right thumb pain   ONSET DATE: Forearm Pain: Last 3 months  Thumb: 5 years   SUBJECTIVE:  SUBJECTIVE STATEMENT: Patient has been traveling a lot. She has not been able to do her exercises much. She has trie a few of them. She has also had some pain in her shoulder.     PERTINENT HISTORY: Right knee scope; Left thumb clean out  Hypertension;    PAIN:  Are you having pain? Yes VAS scale: 4/10 can get up higher with movement  Pain location: right medial forearm  Pain orientation: Right  PAIN TYPE: aching Pain description: constant fluctuates though  Aggravating factors: turning the arm;  Relieving factors: not overusing the elbows     Are you having pain? Yes VAS scale: 2/10 Pain location: Right CMC  Pain orientation: Right  PAIN TYPE: aching Pain description: intermittent  Aggravating factors: use of the thumb  Relieving factors: rest    PATIENT EDUCATION:      PRECAUTIONS: None   WEIGHT BEARING RESTRICTIONS No   FALLS:  Has patient fallen in last 6 months? No Number of falls:    LIVING ENVIRONMENT: Nothing pertinent    PLOF: Independent   Hobbies: Walking/ doing Probation officer    PATIENT GOALS : to have less pain and to return to the gym OBJECTIVE:                       TODAY'S TREATMENT:  12/22 Putty squeeze x20  Putty finger tip grip x20  Key grip x20   Supination x20  Wrist extension x20 1lb Wrist flexion x20   Muaual Therapy: IASTYM to ECRB TFM to lateral epicodyle Radial head mobilization  Reviewed self stretching   Eval:   Reviewed  self IASTYM using coco butter   Seated Wrist Flexion Stretch - 1 x daily - 7 x weekly - 3 sets - 3 reps - 10 hold Seated Wrist Extension Stretch - 1 x daily - 7 x weekly - 3 sets - 3 reps - 10sec hold Seated Wrist Flexion with Dumbbell - 1 x daily - 7 x weekly - 3 sets - 10 reps Seated Wrist Extension with Dumbbell - 1 x daily - 7 x weekly - 3 sets - 10 reps   PATIENT EDUCATION: Education details: reviewed use of the putty for hand strengthening; advised not to overuse  Person educated: Patient Education method: Explanation, Demonstration, Tactile cues, Verbal cues, and Handouts Education comprehension: verbalized understanding, returned demonstration, and needs further education     HOME EXERCISE PROGRAM: Access Code: [email or web address] URL: [email or web address] Date: 01/15/2021 Prepared by: Carolyne Littles   Program Notes using the handle of the butter knife run it down the length of the muscle     Exercises Seated Wrist Flexion Stretch - 1 x daily - 7 x weekly - 3 sets - 3 reps - 10 hold Seated Wrist Extension Stretch - 1 x daily - 7 x weekly - 3 sets - 3 reps - 10sec hold Seated Wrist Flexion with Dumbbell - 1 x daily - 7 x weekly - 3 sets - 10 reps Seated Wrist Extension with Dumbbell - 1 x daily - 7 x weekly - 3 sets - 10 reps     ASSESSMENT:   CLINICAL IMPRESSION: The patient has been on vacation and has not had much time to do her exercises. Despite that fact she tolerated treatment well today. She was lacking end range supination but achieved end range with manual therapy. Therapy reviewed self soft tissue mobilization using the handle of a butter knife. We will consider TPDN next visit.  Therapy advanced her weight with supination pronation. She was given putty for home. She was advised not to overdo it.    Objective impairments include decreased activity tolerance, decreased endurance, decreased ROM, decreased strength, impaired flexibility, impaired UE functional  use, and pain. These impairments are limiting patient from cleaning, meal prep, occupation, and yard work. Personal factors including 1-2 comorbidities: bilateral thumb OA;   are also affecting patient's functional outcome. Patient will benefit from skilled PT to address above impairments and improve overall function.   REHAB POTENTIAL: Good   CLINICAL DECISION MAKING: Evolving/moderate complexity evolving pain in her forearm and right thumb    EVALUATION COMPLEXITY: Moderate     GOALS: Goals reviewed with patient? No   SHORT TERM GOALS:   STG Name Target Date Goal status  1 Patient will increase right grip strength by 5 lbs  Baseline:  02/05/2021 INITIAL  2 Patient will increase right wrist extension by 5 degrees  Baseline:  02/05/2021 INITIAL  3 Patient will report a 50% reduction in right thumb and wrist pain  Baseline: 02/05/2021 INITIAL  LONG TERM GOALS:    LTG Name Target Date Goal status  1 Patient will perform down dog in yoga without pain  Baseline: 02/26/2021 INITIAL  2 Patient will write and perform fine motor tasks without pain  Baseline: 02/26/2021 INITIAL  3 Patient will be independent with complete program for strengthening Baseline: 02/26/2021 INITIAL   PLAN: PT FREQUENCY: 2x/week   PT DURATION: 6 weeks   PLANNED INTERVENTIONS: Therapeutic exercises, Therapeutic activity, Neuro Muscular re-education, Balance training, Gait training, Patient/Family education, Joint mobilization, Aquatic Therapy, Dry Needling, Cryotherapy, Moist heat, and Splintting taping;    PLAN FOR NEXT SESSION: present trigger point dry needling to the patient ( no time on eval) if open needle ECRB and thenar. Manual therapy to medial and lateral epicondyle;        Carney Living 02/06/2021, 9:38 AM

## 2021-02-06 ENCOUNTER — Encounter (HOSPITAL_BASED_OUTPATIENT_CLINIC_OR_DEPARTMENT_OTHER): Payer: Self-pay | Admitting: Physical Therapy

## 2021-02-13 ENCOUNTER — Encounter (HOSPITAL_BASED_OUTPATIENT_CLINIC_OR_DEPARTMENT_OTHER): Payer: Self-pay | Admitting: Physical Therapy

## 2021-02-13 ENCOUNTER — Ambulatory Visit (HOSPITAL_BASED_OUTPATIENT_CLINIC_OR_DEPARTMENT_OTHER): Payer: BC Managed Care – PPO | Admitting: Physical Therapy

## 2021-02-13 ENCOUNTER — Other Ambulatory Visit: Payer: Self-pay

## 2021-02-13 DIAGNOSIS — M79601 Pain in right arm: Secondary | ICD-10-CM | POA: Diagnosis not present

## 2021-02-13 DIAGNOSIS — M25541 Pain in joints of right hand: Secondary | ICD-10-CM

## 2021-02-13 NOTE — Therapy (Signed)
OUTPATIENT PHYSICAL THERAPY TREATMENT NOTE   Patient Name: PERNIE GROSSO MRN: [id number] DOB:[birth year], 60 y.o., female Today's Date: 02/14/2021  PCP: Bernerd Limbo, MD REFERRING PROVIDER: Bernerd Limbo, MD   PT End of Session - 02/14/21 1245     Visit Number 3    Number of Visits 12    Date for PT Re-Evaluation 02/26/21    PT Start Time 1315    PT Stop Time 1324    PT Time Calculation (min) 43 min    Activity Tolerance Patient tolerated treatment well    Behavior During Therapy WFL for tasks assessed/performed             Past Medical History:  Diagnosis Date   ADHD (attention deficit hyperactivity disorder)    Allergy    Arthritis    Asthma    exercised inducded asthma    GERD (gastroesophageal reflux disease)    Glaucoma    Hiatal hernia    Mass of finger    LEFT THUMB   Pain of left thumb    PONV (postoperative nausea and vomiting)    Wears glasses    White coat syndrome without hypertension    Past Surgical History:  Procedure Laterality Date   [place] W/ RESECTION POLYP  04/2015   KNEE ARTHROSCOPY Right 2010   MASS EXCISION Left 10/15/2015   Procedure: LEFT THUMB EXCISION MASS WITH ARTHROTOMY AND SYNOVECTOMY;  Surgeon: Roseanne Kaufman, MD;  Location: [facility];  Service: Orthopedics;  Laterality: Left;   SHOULDER ARTHROSCOPY Left 2007   buritis   TONSILLECTOMY AND ADENOIDECTOMY  as child   Patient Active Problem List   Diagnosis Date Noted   Glaucoma 05/25/2017   Hyperlipidemia 11/12/2015   Generalized abdominal pain 01/20/2015   Bloating 01/20/2015   Nausea with vomiting 01/20/2015   Diarrhea 01/20/2015   Menopausal and perimenopausal disorder 2014 11/23/2012   ADD (attention deficit disorder) 08/05/2011   AR (allergic rhinitis) 08/05/2011   DDD (degenerative disc disease), cervical 08/05/2011   Osteoarthritis, shoulder 08/05/2011   Osteopenia 08/05/2011    ESOPHAGEAL REFLUX 03/26/2009   OSTEOARTHRITIS 03/26/2009   PCP: Bernerd Limbo, MD   REFERRING PROVIDER: Roseanne Kaufman, MD   REFERRING DIAG: right forearm pain/ right thumb pain   THERAPY DIAG:  right forearm pain/ right thumb pain   ONSET DATE: Forearm Pain: Last 3 months  Thumb: 5 years   SUBJECTIVE:  SUBJECTIVE STATEMENT: The patient reports she is doing better. She is still having difficulty with wrist extension. She has been to a massage therapist earlier who performed about an hour of trigger point release on the forearm and shoulder.   PERTINENT HISTORY: Right knee scope; Left thumb clean out  Hypertension;    PAIN:  Are you having pain? Yes VAS scale: "Not much pain today " 12/30 Pain location: right medial forearm  Pain orientation: Right  PAIN TYPE: aching Pain description: constant fluctuates though  Aggravating factors: turning the arm;  Relieving factors: not overusing the elbows      Are you having pain? Yes VAS scale: 2/10 Pain location: Right CMC  Pain orientation: Right  PAIN TYPE: aching Pain description: intermittent  Aggravating factors: use of the thumb  Relieving factors: rest    PATIENT EDUCATION:      PRECAUTIONS: None   WEIGHT BEARING RESTRICTIONS No   FALLS:  Has patient fallen in last 6 months? No Number of falls:    LIVING ENVIRONMENT: Nothing pertinent    PLOF: Independent   Hobbies: Walking/ doing Probation officer    PATIENT GOALS : to have less pain and to return to the gym OBJECTIVE:    Active wrist extension on right equals left but more tension felt on the right.                    TODAY'S TREATMENT:  12/29 Supination x20  2lb  Wrist extension x20 2lbs lb Wrist flexion x20 2lbs   No manual performed 2nd to full range and the  fact the massage therapy just performed an hour of manual therapy on her arm  Reviewed gym exercises vs band    Cable shoulder extension and row 2x10 10lbs reviewed grip on t-bar   Shoulder IR /ER x10 each yellow given red for home/ reviewed grip for hom   Shoulder extension x10 yellow  Shoulder row x10 yellow      12/22 Putty squeeze x20  Putty finger tip grip x20  Key grip x20    Supination x20  Wrist extension x20 1lb Wrist flexion x20    Muaual Therapy: IASTYM to ECRB TFM to lateral epicodyle Radial head mobilization  Reviewed self stretching    Eval:    Reviewed self IASTYM using coco butter   Seated Wrist Flexion Stretch - 1 x daily - 7 x weekly - 3 sets - 3 reps - 10 hold Seated Wrist Extension Stretch - 1 x daily - 7 x weekly - 3 sets - 3 reps - 10sec hold Seated Wrist Flexion with Dumbbell - 1 x daily - 7 x weekly - 3 sets - 10 reps Seated Wrist Extension with Dumbbell - 1 x daily - 7 x weekly - 3 sets - 10 reps   PATIENT EDUCATION: Education details: reviewed use of the putty for hand strengthening; advised not to overuse  Person educated: Patient Education method: Explanation, Demonstration, Tactile cues, Verbal cues, and Handouts Education comprehension: verbalized understanding, returned demonstration, and needs further education     HOME EXERCISE PROGRAM: Access Code: [email or web address] URL: [email or web address] Date: 01/15/2021 Prepared by: Carolyne Littles   Program Notes using the handle of the butter knife run it down the length of the muscle     Exercises Seated Wrist Flexion Stretch - 1 x daily - 7 x weekly - 3 sets - 3 reps - 10 hold Seated Wrist Extension Stretch - 1 x daily - 7 x weekly -  3 sets - 3 reps - 10sec hold Seated Wrist Flexion with Dumbbell - 1 x daily - 7 x weekly - 3 sets - 10 reps Seated Wrist Extension with Dumbbell - 1 x daily - 7 x weekly - 3 sets - 10 reps     ASSESSMENT:   CLINICAL IMPRESSION: The patient has  been on vacation and has not had much time to do her exercises. Despite that fact she tolerated treatment well today. She was lacking end range supination but achieved end range with manual therapy. Therapy reviewed self soft tissue mobilization using the handle of a butter knife. We will consider TPDN next visit. Therapy advanced her weight with supination pronation. She was given putty for home. She was advised not to overdo it.    Objective impairments include decreased activity tolerance, decreased endurance, decreased ROM, decreased strength, impaired flexibility, impaired UE functional use, and pain. These impairments are limiting patient from cleaning, meal prep, occupation, and yard work. Personal factors including 1-2 comorbidities: bilateral thumb OA;   are also affecting patient's functional outcome. Patient will benefit from skilled PT to address above impairments and improve overall function.   REHAB POTENTIAL: Good   CLINICAL DECISION MAKING: Evolving/moderate complexity evolving pain in her forearm and right thumb    EVALUATION COMPLEXITY: Moderate     GOALS: Goals reviewed with patient? No   SHORT TERM GOALS:   STG Name Target Date Goal status  1 Patient will increase right grip strength by 5 lbs  Baseline:  02/05/2021 INITIAL  2 Patient will increase right wrist extension by 5 degrees  Baseline:  02/05/2021 INITIAL  3 Patient will report a 50% reduction in right thumb and wrist pain  Baseline: 02/05/2021 INITIAL  LONG TERM GOALS:    LTG Name Target Date Goal status  1 Patient will perform down dog in yoga without pain  Baseline: 02/26/2021 INITIAL  2 Patient will write and perform fine motor tasks without pain  Baseline: 02/26/2021 INITIAL  3 Patient will be independent with complete program for strengthening Baseline: 02/26/2021 INITIAL   PLAN: PT FREQUENCY: 2x/week   PT DURATION: 6 weeks   PLANNED INTERVENTIONS: Therapeutic exercises, Therapeutic activity, Neuro  Muscular re-education, Balance training, Gait training, Patient/Family education, Joint mobilization, Aquatic Therapy, Dry Needling, Cryotherapy, Moist heat, and Splintting taping;    PLAN FOR NEXT SESSION: present trigger point dry needling to the patient ( no time on eval) if open needle ECRB and thenar. Manual therapy to medial and lateral epicondyle;            Carney Living PT DPT  02/14/2021, 12:52 PM

## 2021-02-14 ENCOUNTER — Encounter (HOSPITAL_BASED_OUTPATIENT_CLINIC_OR_DEPARTMENT_OTHER): Payer: Self-pay | Admitting: Physical Therapy

## 2021-02-18 ENCOUNTER — Encounter (HOSPITAL_BASED_OUTPATIENT_CLINIC_OR_DEPARTMENT_OTHER): Payer: BC Managed Care – PPO | Admitting: Physical Therapy

## 2021-02-24 ENCOUNTER — Ambulatory Visit (HOSPITAL_BASED_OUTPATIENT_CLINIC_OR_DEPARTMENT_OTHER): Payer: BC Managed Care – PPO | Admitting: Physical Therapy

## 2021-06-24 ENCOUNTER — Ambulatory Visit: Payer: BC Managed Care – PPO | Admitting: Radiology

## 2021-06-24 ENCOUNTER — Encounter: Payer: Self-pay | Admitting: Radiology

## 2021-06-24 VITALS — BP 130/82 | Temp 98.3°F | Ht 64.5 in | Wt 151.0 lb

## 2021-06-24 DIAGNOSIS — R102 Pelvic and perineal pain: Secondary | ICD-10-CM

## 2021-06-24 LAB — URINALYSIS, COMPLETE W/RFL CULTURE
Bacteria, UA: NONE SEEN /HPF
Bilirubin Urine: NEGATIVE
Casts: NONE SEEN /LPF
Crystals: NONE SEEN /HPF
Glucose, UA: NEGATIVE
Hgb urine dipstick: NEGATIVE
Hyaline Cast: NONE SEEN /LPF
Ketones, ur: NEGATIVE
Leukocyte Esterase: NEGATIVE
Nitrites, Initial: NEGATIVE
Protein, ur: NEGATIVE
RBC / HPF: NONE SEEN /HPF (ref 0–2)
Specific Gravity, Urine: 1.01 (ref 1.001–1.035)
WBC, UA: NONE SEEN /HPF (ref 0–5)
Yeast: NONE SEEN /HPF
pH: 6 (ref 5.0–8.0)

## 2021-06-24 LAB — NO CULTURE INDICATED

## 2021-06-24 NOTE — Progress Notes (Signed)
? ? ? ? ?  Anita Snow is 61 y.o. female presenting with complaint of having breast fullness/tenerness and lower pelvic pressure/cramping, weight gain & feels hormones are "off".  Currently using testosterone 2% compounded cream and not 4% (dose was reduced due to facial hair growth).  ?Symptoms began after she started beta blocker ? ?Subjective: ?Review of Systems  ?Constitutional: Negative.   ?Gastrointestinal:  Positive for abdominal distention and abdominal pain.  ?Genitourinary: Negative.   ?Musculoskeletal: Negative.   ?Skin: Negative.   ?Neurological: Negative.   ?Psychiatric/Behavioral: Negative.     ? ?Objective:  ?Physical Exam ?Constitutional:   ?   Appearance: Normal appearance. She is normal weight.  ?HENT:  ?   Head: Normocephalic and atraumatic.  ?Cardiovascular:  ?   Rate and Rhythm: Normal rate.  ?Pulmonary:  ?   Effort: Pulmonary effort is normal.  ?Abdominal:  ?   General: Abdomen is flat. Bowel sounds are normal.  ?   Palpations: Abdomen is soft.  ?Musculoskeletal:     ?   General: Normal range of motion.  ?   Cervical back: Normal range of motion and neck supple.  ?Neurological:  ?   General: No focal deficit present.  ?   Mental Status: She is alert and oriented to person, place, and time. Mental status is at baseline.  ?Skin: ?   General: Skin is warm and dry.  ?Pelvic exam: normal external genitalia, vulva, vagina, cervix, uterus and adnexa. ? ?Chaperone offered and declined for exam. ? ?Assessment/Plan: ?1. Pelvic pain ?- Urinalysis,Complete w/RFL Culture  ?Declines u/s at this time ?Will monitor weight and protein/fiber intake ?Continue current med regimen ?Reassured unlikely being caused by vaginal imvexxy ?RTO August for AEX ?  ?

## 2021-09-03 ENCOUNTER — Telehealth: Payer: Self-pay

## 2021-09-03 MED ORDER — IMVEXXY MAINTENANCE PACK 10 MCG VA INST: VAGINAL_INSERT | VAGINAL | 4 refills | Status: DC

## 2021-09-03 NOTE — Telephone Encounter (Signed)
Received a prescription request via fax for this patient for Imvexxy 10 mcg Maintenance Pack vaginal insert.  Rx: Imvexxy 10 mc Maintenance Pack Vaginal insert Count #8  They need directions and you to sign Rx.  I linked the Rx. Do not see where you every prescribed and the Rx on file in her chart is dates 12/22/2019.

## 2021-09-10 ENCOUNTER — Other Ambulatory Visit: Payer: Self-pay

## 2021-09-10 MED ORDER — NONFORMULARY OR COMPOUNDED ITEM: 1 refills | Status: DC

## 2021-09-10 NOTE — Telephone Encounter (Signed)
Rx called into [facility]  

## 2021-10-02 ENCOUNTER — Other Ambulatory Visit: Payer: Self-pay | Admitting: *Deleted

## 2021-10-02 MED ORDER — PROGESTERONE MICRONIZED 100 MG PO CAPS
200.0000 mg | ORAL_CAPSULE | Freq: Every day | ORAL | 0 refills | Status: DC
Start: 2021-10-02 — End: 2022-03-03

## 2021-10-02 NOTE — Telephone Encounter (Signed)
Ok to refill #30

## 2021-10-02 NOTE — Telephone Encounter (Signed)
Patient called stating she needs refill on progesterone 100 mg capsule. She takes 2 tablets of 100 mg at bedtime. She has annual exam scheduled for next week on 10/15/21. Please advise

## 2021-10-10 ENCOUNTER — Ambulatory Visit: Payer: BC Managed Care – PPO | Admitting: Radiology

## 2021-10-15 ENCOUNTER — Encounter: Payer: Self-pay | Admitting: Radiology

## 2021-10-15 ENCOUNTER — Other Ambulatory Visit (HOSPITAL_COMMUNITY)
Admission: RE | Admit: 2021-10-15 | Discharge: 2021-10-15 | Disposition: A | Discharge status: Home / Self Care | Payer: BC Managed Care – PPO | Source: Ambulatory Visit | Attending: Radiology | Admitting: Radiology

## 2021-10-15 ENCOUNTER — Ambulatory Visit (INDEPENDENT_AMBULATORY_CARE_PROVIDER_SITE_OTHER): Payer: BC Managed Care – PPO | Admitting: Radiology

## 2021-10-15 VITALS — BP 134/82 | Ht 64.0 in | Wt 156.0 lb

## 2021-10-15 DIAGNOSIS — Z01419 Encounter for gynecological examination (general) (routine) without abnormal findings: Secondary | ICD-10-CM | POA: Diagnosis not present

## 2021-10-15 DIAGNOSIS — Z79890 Hormone replacement therapy (postmenopausal): Secondary | ICD-10-CM

## 2021-10-15 DIAGNOSIS — M8580 Other specified disorders of bone density and structure, unspecified site: Secondary | ICD-10-CM

## 2021-10-15 NOTE — Progress Notes (Signed)
   Anita Snow [facility] [birth year] [id number]   History: Postmenopausal 61 y.o. presents for annual exam. No gyn concerns, doing well on current HRT. Due for DEXA this year, hx of osteopenia.   Gynecologic History Postmenopausal Last Pap: 2015. Results were: normal Last mammogram: 2022. Results were: normal Last colonoscopy: 2022 HRT use: current  Obstetric History OB History  Gravida Para Term Preterm AB Living  '3 2     1 2  '$ SAB IAB Ectopic Multiple Live Births  1       2    # Outcome Date GA Lbr Len/2nd Weight Sex Delivery Anes PTL Lv  3 SAB           2 Para           1 Para              The following portions of the patient's history were reviewed and updated as appropriate: allergies, current medications, past family history, past medical history, past social history, past surgical history, and problem list.  Review of Systems Pertinent items noted in HPI and remainder of comprehensive ROS otherwise negative.  Past medical history, past surgical history, family history and social history were all reviewed and documented in the EPIC chart.  Exam:  Vitals:   10/15/21 0802  BP: 134/82  Weight: 156 lb (70.8 kg)  Height: '5\' 4"'$  (1.626 m)   Body mass index is 26.78 kg/m.  General appearance:  Normal Thyroid:  Symmetrical, normal in size, without palpable masses or nodularity. Respiratory  Auscultation:  Clear without wheezing or rhonchi Cardiovascular  Auscultation:  Regular rate, without rubs, murmurs or gallops  Edema/varicosities:  Not grossly evident Abdominal  Soft,nontender, without masses, guarding or rebound.  Liver/spleen:  No organomegaly noted  Hernia:  None appreciated  Skin  Inspection:  Grossly normal Breasts: Examined lying and sitting.   Right: Without masses, retractions, nipple discharge or axillary adenopathy.   Left: Without masses, retractions, nipple discharge or axillary adenopathy. Genitourinary   Inguinal/mons:  Normal without inguinal  adenopathy  External genitalia:  Normal appearing vulva with no masses, tenderness, or lesions  BUS/Urethra/Skene's glands:  Normal  Vagina:  Normal appearing with normal color and discharge, no lesions. Atrophy: mild, improved   Cervix:  Normal appearing without discharge or lesions  Uterus:  Normal in size, shape and contour.  Midline and mobile, nontender  Adnexa/parametria:     Rt: Normal in size, without masses or tenderness.   Lt: Normal in size, without masses or tenderness.  Anus and perineum: Normal    Patient informed chaperone available to be present for breast and pelvic exam. Patient has requested no chaperone to be present. Patient has been advised what will be completed during breast and pelvic exam.   Assessment/Plan:   1. Well woman exam with routine gynecological exam - Cytology - PAP( [facility])  2. Osteopenia, unspecified location - DG Bone Density; Future  3. Hormone replacement therapy (HRT) Continue current regimen with testosterone and progesterone and vaginal imvexxy     Discussed SBE, colonoscopy and DEXA screening as directed. Recommend 176mns of exercise weekly, including weight bearing exercise. Encouraged the use of seatbelts and sunscreen.  Return in 1 year for annual or sooner prn.  CKerry DoryWHNP-BC, 8:43 AM 10/15/2021

## 2021-10-21 LAB — CYTOLOGY - PAP
Adequacy: ABSENT
Comment: NEGATIVE
Diagnosis: NEGATIVE
High risk HPV: NEGATIVE

## 2021-10-23 ENCOUNTER — Other Ambulatory Visit: Payer: Self-pay | Admitting: Family Medicine

## 2021-10-23 DIAGNOSIS — Z1231 Encounter for screening mammogram for malignant neoplasm of breast: Secondary | ICD-10-CM

## 2021-10-28 ENCOUNTER — Ambulatory Visit
Admission: RE | Admit: 2021-10-28 | Discharge: 2021-10-28 | Disposition: A | Discharge status: Home / Self Care | Payer: BC Managed Care – PPO | Source: Ambulatory Visit | Attending: Family Medicine | Admitting: Family Medicine

## 2021-10-28 DIAGNOSIS — Z1231 Encounter for screening mammogram for malignant neoplasm of breast: Secondary | ICD-10-CM

## 2022-02-12 ENCOUNTER — Other Ambulatory Visit: Payer: Self-pay

## 2022-02-12 MED ORDER — NONFORMULARY OR COMPOUNDED ITEM: 1 refills | Status: DC

## 2022-02-12 NOTE — Telephone Encounter (Signed)
[facility] called [facility]. Spoke with [employer]

## 2022-02-12 NOTE — Telephone Encounter (Signed)
Medication refill request: testosterone 0.2%cream Last AEX:  10-15-21 Next AEX: not scheduled Last MMG (if hormonal medication request): 10-28-21 neg Refill authorized: please approve or deny appropriate. Last refilled 09-10-21 30 gram tube with 1 refill

## 2022-02-16 HISTORY — PX: FINGER SURGERY: SHX640

## 2022-02-28 ENCOUNTER — Other Ambulatory Visit: Payer: Self-pay | Admitting: Radiology

## 2022-03-03 NOTE — Telephone Encounter (Signed)
Med refill request:progesterone 100 mg caps, take 200 mg PO at HS Last AEX: 10/15/21/ JC Next AEX: Not scheduled  Last MMG (if hormonal med) 10/28/21, BiRads 1 neg  Rx pended #180/ 1RF  Refill authorized: Please Advise?

## 2022-03-23 ENCOUNTER — Other Ambulatory Visit: Payer: Self-pay | Admitting: Nurse Practitioner

## 2022-03-23 ENCOUNTER — Telehealth: Payer: Self-pay

## 2022-03-23 DIAGNOSIS — B3731 Acute candidiasis of vulva and vagina: Secondary | ICD-10-CM

## 2022-03-23 MED ORDER — FLUCONAZOLE 150 MG PO TABS: 150.0000 mg | ORAL_TABLET | ORAL | 0 refills | Status: DC

## 2022-03-23 NOTE — Telephone Encounter (Signed)
Pt notified and voiced understanding 

## 2022-03-23 NOTE — Telephone Encounter (Signed)
Diflucan sent to pharmacy. Thanks.

## 2022-03-23 NOTE — Telephone Encounter (Signed)
JC pt/UTD on AEX. Calling to request medication for yeast infection. States that she had recent hand surgery and had to be on abxs and feels as if developed yeast infection. Please advise.

## 2022-05-19 ENCOUNTER — Ambulatory Visit: Payer: BC Managed Care – PPO | Admitting: Radiology

## 2022-05-19 VITALS — BP 140/84

## 2022-05-19 DIAGNOSIS — N760 Acute vaginitis: Secondary | ICD-10-CM | POA: Diagnosis not present

## 2022-05-19 DIAGNOSIS — R300 Dysuria: Secondary | ICD-10-CM | POA: Diagnosis not present

## 2022-05-19 LAB — URINALYSIS, COMPLETE W/RFL CULTURE
Bacteria, UA: NONE SEEN /HPF
Bilirubin Urine: NEGATIVE
Casts: NONE SEEN /LPF
Crystals: NONE SEEN /HPF
Glucose, UA: NEGATIVE
Hgb urine dipstick: NEGATIVE
Hyaline Cast: NONE SEEN /LPF
Ketones, ur: NEGATIVE
Leukocyte Esterase: NEGATIVE
Nitrites, Initial: NEGATIVE
Protein, ur: NEGATIVE
RBC / HPF: NONE SEEN /HPF (ref 0–2)
Specific Gravity, Urine: 1.015 (ref 1.001–1.035)
WBC, UA: NONE SEEN /HPF (ref 0–5)
Yeast: NONE SEEN /HPF
pH: 5.5 (ref 5.0–8.0)

## 2022-05-19 LAB — NO CULTURE INDICATED

## 2022-05-19 LAB — WET PREP FOR TRICH, YEAST, CLUE

## 2022-05-19 NOTE — Progress Notes (Signed)
      Subjective: Anita Snow is a 62 y.o. female who complains of vaginal discharge, thick, swollen labia, irritation/discomfort, dysuria, frequency. Symptoms began last week (while at the beach). She tried nystatin/triamcinolone and monistat inserts (used last insert Saturday night) and it helped significantly.     Review of Systems  All other systems reviewed and are negative.   Past Medical History:  Diagnosis Date   ADHD (attention deficit hyperactivity disorder)    Allergy    Arthritis    Asthma    exercised inducded asthma    GERD (gastroesophageal reflux disease)    Glaucoma    Hiatal hernia    Mass of finger    LEFT THUMB   Pain of left thumb    PONV (postoperative nausea and vomiting)    Wears glasses    White coat syndrome without hypertension       Objective:  Today's Vitals   05/19/22 1007 05/19/22 1012  BP: (!) 144/86 (!) 140/84   There is no height or weight on file to calculate BMI.   -General: no acute distress -Vulva: without lesions or discharge -Vagina: discharge present, aptima swab and wet prep obtained -Cervix: no lesion or discharge, no CMT -Perineum: no lesions -Uterus: Mobile, non tender -Adnexa: no masses or tenderness  Urine dipstick shows negative for all components.  Micro exam: negative for WBC's or RBC's.  Microscopic wet-mount exam shows negative for pathogens, normal epithelial cells.   Chaperone offered and declined.  Assessment:/Plan:   1. Dysuria - Urinalysis,Complete w/RFL Culture  2. Vulvovaginitis - WET PREP FOR [place], YEAST, CLUE  Reassured negative, baking soda soaks for comfort   Avoid intercourse until symptoms are resolved. Safe sex encouraged. Avoid the use of soaps or perfumed products in the peri area. Avoid tub baths and sitting in sweaty or wet clothing for prolonged periods of time.

## 2022-05-22 ENCOUNTER — Telehealth: Payer: Self-pay

## 2022-05-22 DIAGNOSIS — N760 Acute vaginitis: Secondary | ICD-10-CM

## 2022-05-22 MED ORDER — FLUCONAZOLE 150 MG PO TABS: 150.0000 mg | ORAL_TABLET | Freq: Once | ORAL | 0 refills | Status: AC

## 2022-05-22 NOTE — Telephone Encounter (Signed)
Pt seen for vulvovaginitis on 05/19/2022.   States she was advised to contact us if feels as if yeast is returning and we would send rx for diflucan.  Pt reporting continued itching/irritation and is desiring the rx. Please advise.

## 2022-05-22 NOTE — Telephone Encounter (Signed)
Rx sent and pt notified. Will close encounter.

## 2022-05-22 NOTE — Telephone Encounter (Signed)
Ok to send 150mg  Fluconazole

## 2022-07-17 ENCOUNTER — Other Ambulatory Visit: Payer: Self-pay

## 2022-07-17 DIAGNOSIS — Z79890 Hormone replacement therapy (postmenopausal): Secondary | ICD-10-CM

## 2022-07-17 NOTE — Telephone Encounter (Signed)
Last AEX 10/15/2021--recall placed for 09/2022. Last mammo 10/28/2021--neg birads 1  Rx pend.

## 2022-07-20 MED ORDER — NONFORMULARY OR COMPOUNDED ITEM: 0 refills | Status: DC

## 2022-07-24 NOTE — Telephone Encounter (Signed)
Pt LVM in triage line stating [facility] doesn't have refill for pt's testosterone Cream.  Will call pharmacy.  Spoke w/ Betsy at the pharmacy and she reported that they did not receive new rx for the pt so I gave them the verbal order to fill. Will route to provider for final review.

## 2022-08-11 ENCOUNTER — Encounter (INDEPENDENT_AMBULATORY_CARE_PROVIDER_SITE_OTHER): Payer: BC Managed Care – PPO

## 2022-08-11 ENCOUNTER — Other Ambulatory Visit: Payer: Self-pay | Admitting: Radiology

## 2022-08-11 ENCOUNTER — Other Ambulatory Visit: Payer: Self-pay

## 2022-08-11 DIAGNOSIS — Z780 Asymptomatic menopausal state: Secondary | ICD-10-CM

## 2022-08-11 DIAGNOSIS — M8580 Other specified disorders of bone density and structure, unspecified site: Secondary | ICD-10-CM

## 2022-08-11 DIAGNOSIS — M8589 Other specified disorders of bone density and structure, multiple sites: Secondary | ICD-10-CM

## 2022-08-11 DIAGNOSIS — Z13820 Encounter for screening for osteoporosis: Secondary | ICD-10-CM | POA: Diagnosis not present

## 2022-08-28 ENCOUNTER — Other Ambulatory Visit: Payer: Self-pay | Admitting: Radiology

## 2022-08-28 NOTE — Telephone Encounter (Signed)
Medication refill request: progesterone 100mg  Last AEX:  10-15-21 Next AEX: 10-07-22 Last MMG (if hormonal medication request): 10-28-21 Refill authorized: please approve if appropriate

## 2022-08-31 ENCOUNTER — Other Ambulatory Visit: Payer: Self-pay

## 2022-08-31 NOTE — Telephone Encounter (Signed)
Medication refill request: Imvexxy vaginal insert Last AEX:  10-15-21 Next AEX: 10-07-22 Last MMG (if hormonal medication request): 10-28-21 birads 1:neg Refill authorized: please approve if appropriate

## 2022-09-01 MED ORDER — IMVEXXY MAINTENANCE PACK 10 MCG VA INST: VAGINAL_INSERT | VAGINAL | 0 refills | Status: DC

## 2022-10-07 ENCOUNTER — Ambulatory Visit (INDEPENDENT_AMBULATORY_CARE_PROVIDER_SITE_OTHER): Payer: BC Managed Care – PPO | Admitting: Radiology

## 2022-10-07 ENCOUNTER — Encounter: Payer: Self-pay | Admitting: Radiology

## 2022-10-07 VITALS — BP 142/80 | Ht 63.75 in | Wt 162.0 lb

## 2022-10-07 DIAGNOSIS — M8580 Other specified disorders of bone density and structure, unspecified site: Secondary | ICD-10-CM

## 2022-10-07 DIAGNOSIS — Z01419 Encounter for gynecological examination (general) (routine) without abnormal findings: Secondary | ICD-10-CM | POA: Diagnosis not present

## 2022-10-07 DIAGNOSIS — Z79890 Hormone replacement therapy (postmenopausal): Secondary | ICD-10-CM

## 2022-10-07 DIAGNOSIS — N958 Other specified menopausal and perimenopausal disorders: Secondary | ICD-10-CM

## 2022-10-07 MED ORDER — PROGESTERONE MICRONIZED 100 MG PO CAPS: 200.0000 mg | ORAL_CAPSULE | Freq: Every day | ORAL | 4 refills | Status: DC

## 2022-10-07 MED ORDER — IMVEXXY MAINTENANCE PACK 10 MCG VA INST: VAGINAL_INSERT | VAGINAL | 4 refills | Status: DC

## 2022-10-07 NOTE — Progress Notes (Signed)
   Anita Snow [facility] Nov 08, 1960 [id number]   History: Postmenopausal 62 y.o. presents for annual exam. No gyn concerns, doing well on current HRT. Osteopenia on DEXA.Has a cardiologist who follows her BP, HLD and cardiac plaques.   Gynecologic History Postmenopausal Last Pap: 2015. Results were: normal Last mammogram: 2022. Results were: normal Last colonoscopy: 2022 HRT use: current  Obstetric History OB History  Gravida Para Term Preterm AB Living  3 2     1 2   SAB IAB Ectopic Multiple Live Births  1       2    # Outcome Date GA Lbr Len/2nd Weight Sex Type Anes PTL Lv  3 SAB           2 Para           1 Para              The following portions of the patient's history were reviewed and updated as appropriate: allergies, current medications, past family history, past medical history, past social history, past surgical history, and problem list.  Review of Systems Pertinent items noted in HPI and remainder of comprehensive ROS otherwise negative.  Past medical history, past surgical history, family history and social history were all reviewed and documented in the EPIC chart.  Exam:  Vitals:   10/07/22 1350 10/07/22 1357  BP: (!) 152/84 (!) 142/80  Weight: 162 lb (73.5 kg)   Height: 5' 3.75" (1.619 m)    Body mass index is 28.03 kg/m.  General appearance:  Normal Thyroid:  Symmetrical, normal in size, without palpable masses or nodularity. Respiratory  Auscultation:  Clear without wheezing or rhonchi Cardiovascular  Auscultation:  Regular rate, without rubs, murmurs or gallops  Edema/varicosities:  Not grossly evident Abdominal  Soft,nontender, without masses, guarding or rebound.  Liver/spleen:  No organomegaly noted  Hernia:  None appreciated  Skin  Inspection:  Grossly normal Breasts: Examined lying and sitting.   Right: Without masses, retractions, nipple discharge or axillary adenopathy.   Left: Without masses, retractions, nipple discharge or axillary  adenopathy. Genitourinary   Inguinal/mons:  Normal without inguinal adenopathy  External genitalia:  Normal appearing vulva with no masses, tenderness, or lesions  BUS/Urethra/Skene's glands:  Normal  Vagina:  Normal appearing with normal color and discharge, no lesions. Atrophy: mild, improved   Cervix:  Normal appearing without discharge or lesions  Uterus:  Normal in size, shape and contour.  Midline and mobile, nontender  Adnexa/parametria:     Rt: Normal in size, without masses or tenderness.   Lt: Normal in size, without masses or tenderness.  Anus and perineum: Normal    Raynelle Fanning, CMA present for exam  Assessment/Plan:   1. Well woman exam with routine gynecological exam - Cytology - PAP( [facility]) - Labs with PCP  2. Osteopenia, unspecified location vitD3 5000iu with [id number] nightly Increase weight bearing exercise  3. Hormone replacement therapy (HRT) Continue current regimen with testosterone and progesterone and vaginal imvexxy     Discussed SBE, colonoscopy and DEXA screening as directed. Recommend of exercise weekly, including weight bearing exercise. Encouraged the use of seatbelts and sunscreen.  Return in 1 year for annual or sooner prn.  Arlie Solomons B WHNP-BC, 2:26 PM 10/07/2022

## 2022-11-03 ENCOUNTER — Other Ambulatory Visit: Payer: Self-pay | Admitting: Family Medicine

## 2022-11-03 DIAGNOSIS — Z1239 Encounter for other screening for malignant neoplasm of breast: Secondary | ICD-10-CM

## 2022-11-17 ENCOUNTER — Other Ambulatory Visit: Payer: Self-pay

## 2022-11-17 DIAGNOSIS — Z79890 Hormone replacement therapy (postmenopausal): Secondary | ICD-10-CM

## 2022-11-17 MED ORDER — NONFORMULARY OR COMPOUNDED ITEM: 0 refills | Status: DC

## 2022-11-17 NOTE — Telephone Encounter (Signed)
Med refill request: Testosterone 0.2% Cream Last AEX: 10/07/2022 Next AEX: recall placed for 09/2023 Last MMG (if hormonal med): 10/28/2021-scheduled for 11/19/2022 Refill authorized: rx pend.  **no levels seen checked recently. Please advise if you would like to have pt come in for testosterone level check prior to sending in refill. Thanks.

## 2022-11-18 NOTE — Telephone Encounter (Signed)
Rx phoned into pharmacy.

## 2022-11-19 ENCOUNTER — Ambulatory Visit
Admission: RE | Admit: 2022-11-19 | Discharge: 2022-11-19 | Disposition: A | Discharge status: Home / Self Care | Payer: BC Managed Care – PPO | Source: Ambulatory Visit | Attending: Family Medicine | Admitting: Family Medicine

## 2022-11-19 DIAGNOSIS — Z1239 Encounter for other screening for malignant neoplasm of breast: Secondary | ICD-10-CM

## 2023-03-08 ENCOUNTER — Other Ambulatory Visit: Payer: Self-pay

## 2023-03-08 DIAGNOSIS — Z79890 Hormone replacement therapy (postmenopausal): Secondary | ICD-10-CM

## 2023-03-08 MED ORDER — NONFORMULARY OR COMPOUNDED ITEM: 0 refills | Status: DC

## 2023-03-08 NOTE — Telephone Encounter (Signed)
Med refill request: Testosterone Cream Last AEX: 10/07/2022-JC Next AEX: nothing currently  Last MMG (if hormonal med): UTD Refill authorized: rx pend.

## 2023-03-09 NOTE — Telephone Encounter (Signed)
Rx faxed successfully to [facility].

## 2023-03-09 NOTE — Telephone Encounter (Signed)
Lauren w/ [facility] LVM in med refill line @ 10:56am stating that they sent rx request for pt's testosterone cream on 03/05/2023 and have not received a response.   Called pharmacy back to confirm they received the fax. They were unable to locate at the time of the phone call but will cb if unable to find.

## 2023-03-09 NOTE — Addendum Note (Signed)
Addended by: Jodelle Red D on: 03/09/2023 02:17 PM   Modules accepted: Orders

## 2023-06-09 ENCOUNTER — Other Ambulatory Visit: Payer: Self-pay

## 2023-06-09 DIAGNOSIS — Z79890 Hormone replacement therapy (postmenopausal): Secondary | ICD-10-CM

## 2023-06-09 MED ORDER — NONFORMULARY OR COMPOUNDED ITEM: 0 refills | Status: DC

## 2023-06-09 NOTE — Telephone Encounter (Signed)
 Med refill request: compounded testosterone AVB (EMP) 0.2% (2MG /ML) cream Last AEX: 10/07/22 Next AEX: none scheduled Last MMG (if hormonal med) n/a Refill authorized: compounded testosterone 0.2% (2mg /ml) cream Please approve or deny as appropriate.

## 2023-06-09 NOTE — Addendum Note (Signed)
 Addended by: Jaqueline Mering on: 06/09/2023 08:15 AM   Modules accepted: Orders

## 2023-08-05 ENCOUNTER — Encounter: Payer: Self-pay | Admitting: Gastroenterology

## 2023-09-03 ENCOUNTER — Encounter: Payer: Self-pay | Admitting: Advanced Practice Midwife

## 2023-09-24 ENCOUNTER — Encounter: Payer: Self-pay | Admitting: Gastroenterology

## 2023-09-24 ENCOUNTER — Ambulatory Visit

## 2023-09-24 VITALS — Ht 65.0 in | Wt 161.0 lb

## 2023-09-24 DIAGNOSIS — Z860100 Personal history of colon polyps, unspecified: Secondary | ICD-10-CM

## 2023-09-24 MED ORDER — SUTAB 1479-225-188 MG PO TABS: 24.0000 | ORAL_TABLET | Freq: Once | ORAL | 0 refills | Status: AC

## 2023-09-24 NOTE — Progress Notes (Signed)
 Pre visit completed over telephone. Instructions through MyChart and secure email [email or web address]   No egg or soy allergy known to patient  No issues known to pt with past sedation with any surgeries or procedures Patient denies ever being told they had issues or difficulty with intubation  No FH of Malignant Hyperthermia Pt is not on diet pills Pt is not on  home 02  Pt is not on blood thinners  Pt denies issues with constipation  No A fib or A flutter Have any cardiac testing pending-- NO Pt instructed to use [email or web address] or GoodRx for a price reduction on prep    Chart reviewed by DOROTHA Schillings, CRNA

## 2023-09-29 ENCOUNTER — Telehealth: Payer: Self-pay | Admitting: Gastroenterology

## 2023-09-29 NOTE — Telephone Encounter (Signed)
 Verified with 2 ID's Instructed pt hat times to start prep are when MD's want preps to be done. Pt states she understood and has no other questions at this time.

## 2023-09-29 NOTE — Telephone Encounter (Signed)
 Patient called and stated that she would like to know what time exactly she can starts her prep earlier. Patient stated that if she were to start at 4-5 pm is she having to take her second dosage at 3 PM. Patient is requesting a call back. Please advise.

## 2023-09-30 ENCOUNTER — Telehealth: Payer: Self-pay | Admitting: Gastroenterology

## 2023-09-30 ENCOUNTER — Encounter: Payer: Self-pay | Admitting: Gastroenterology

## 2023-09-30 ENCOUNTER — Ambulatory Visit (AMBULATORY_SURGERY_CENTER): Admitting: Gastroenterology

## 2023-09-30 VITALS — BP 132/63 | HR 46 | Temp 97.5°F | Resp 15 | Ht 63.75 in | Wt 161.0 lb

## 2023-09-30 DIAGNOSIS — K635 Polyp of colon: Secondary | ICD-10-CM

## 2023-09-30 DIAGNOSIS — K644 Residual hemorrhoidal skin tags: Secondary | ICD-10-CM

## 2023-09-30 DIAGNOSIS — Z860101 Personal history of adenomatous and serrated colon polyps: Secondary | ICD-10-CM

## 2023-09-30 DIAGNOSIS — K648 Other hemorrhoids: Secondary | ICD-10-CM

## 2023-09-30 DIAGNOSIS — Z1211 Encounter for screening for malignant neoplasm of colon: Secondary | ICD-10-CM | POA: Diagnosis present

## 2023-09-30 DIAGNOSIS — K5730 Diverticulosis of large intestine without perforation or abscess without bleeding: Secondary | ICD-10-CM | POA: Diagnosis not present

## 2023-09-30 DIAGNOSIS — Z860100 Personal history of colon polyps, unspecified: Secondary | ICD-10-CM

## 2023-09-30 DIAGNOSIS — D125 Benign neoplasm of sigmoid colon: Secondary | ICD-10-CM

## 2023-09-30 MED ORDER — SODIUM CHLORIDE 0.9 % IV SOLN: 500.0000 mL | INTRAVENOUS | Status: DC

## 2023-09-30 NOTE — Progress Notes (Signed)
 Sedate, gd SR, tolerated procedure well, VSS, report to RN

## 2023-09-30 NOTE — Op Note (Signed)
 [facility] Patient Name: Anita Snow Procedure Date: 09/30/2023 10:17 AM MRN: [id number] Endoscopist: Gustav ALONSO Mcgee , MD, [id number] Age: 63 Referring MD:  Date of Birth: Apr 23, 1960 Gender: Female Account #: [id number] Procedure:                Colonoscopy Indications:              High risk colon cancer surveillance: Personal                            history of adenoma (10 mm or greater in size), High                            risk colon cancer surveillance: Personal history of                            multiple (3 or more) adenomas Medicines:                Monitored Anesthesia Care Procedure:                Pre-Anesthesia Assessment:                           - Prior to the procedure, a History and Physical                            was performed, and patient medications and                            allergies were reviewed. The patient's tolerance of                            previous anesthesia was also reviewed. The risks                            and benefits of the procedure and the sedation                            options and risks were discussed with the patient.                            All questions were answered, and informed consent                            was obtained. Prior Anticoagulants: The patient has                            taken no anticoagulant or antiplatelet agents. ASA                            Grade Assessment: II - A patient with mild systemic                            disease. After reviewing the risks and benefits,  the patient was deemed in satisfactory condition to                            undergo the procedure.                           After obtaining informed consent, the colonoscope                            was passed under direct vision. Throughout the                            procedure, the patient's blood pressure, pulse, and                            oxygen saturations were  monitored continuously. The                            Olympus Scope [id number] was introduced through the                            anus and advanced to the the cecum, identified by                            appendiceal orifice and ileocecal valve. The                            colonoscopy was performed without difficulty. The                            patient tolerated the procedure well. The quality                            of the bowel preparation was good. The ileocecal                            valve, appendiceal orifice, and rectum were                            photographed. Scope In: 10:25:19 AM Scope Out: 10:40:25 AM Scope Withdrawal Time: 0 hours 10 minutes 17 seconds  Total Procedure Duration: 0 hours 15 minutes 6 seconds  Findings:                 The perianal and digital rectal examinations were                            normal.                           A 4 mm polyp was found in the sigmoid colon. The                            polyp was sessile. The polyp was removed with a  cold snare. Resection and retrieval were complete.                           Scattered small-mouthed diverticula were found in                            the sigmoid colon and descending colon.                           Non-bleeding external and internal hemorrhoids were                            found during retroflexion. The hemorrhoids were                            small. Complications:            No immediate complications. Estimated Blood Loss:     Estimated blood loss was minimal. Impression:               - One 4 mm polyp in the sigmoid colon, removed with                            a cold snare. Resected and retrieved.                           - Diverticulosis in the sigmoid colon and in the                            descending colon.                           - Non-bleeding external and internal hemorrhoids. Recommendation:           - Patient has a  contact number available for                            emergencies. The signs and symptoms of potential                            delayed complications were discussed with the                            patient. Return to normal activities tomorrow.                            Written discharge instructions were provided to the                            patient.                           - Resume previous diet.                           - Continue present medications.                           -  Await pathology results.                           - Repeat colonoscopy in 5 years for surveillance                            based on pathology results. Rukaya Kleinschmidt V. Lella Mullany, MD 09/30/2023 10:46:09 AM This report has been signed electronically.

## 2023-09-30 NOTE — Progress Notes (Signed)
 Pt's states no medical or surgical changes since previsit or office visit.

## 2023-09-30 NOTE — Telephone Encounter (Signed)
 Spoke with husband and then pt.  States, abdominal bloating, gas pains have increased to 7-8 now.  Describes it as sharp pain to left side, area is painful to the touch even. Pt has ambulated, drank warm fluids, has moved in several positions and only passed a small amount of air.  SHe has taken Gas-X.  Dr. Nandigam,  Please advise, Anita Snow

## 2023-09-30 NOTE — Patient Instructions (Signed)
 Resume previous diet. Continue present medications. Awaiting pathology results. Repeat colonoscopy in 5 years for surveillance based on pathology results. Handouts provided on polyps and diverticulosis.   YOU HAD AN ENDOSCOPIC PROCEDURE TODAY AT THE [facility]:   Refer to the procedure report that was given to you for any specific questions about what was found during the examination.  If the procedure report does not answer your questions, please call your gastroenterologist to clarify.  If you requested that your care partner not be given the details of your procedure findings, then the procedure report has been included in a sealed envelope for you to review at your convenience later.  YOU SHOULD EXPECT: Some feelings of bloating in the abdomen. Passage of more gas than usual.  Walking can help get rid of the air that was put into your GI tract during the procedure and reduce the bloating. If you had a lower endoscopy (such as a colonoscopy or flexible sigmoidoscopy) you may notice spotting of blood in your stool or on the toilet paper. If you underwent a bowel prep for your procedure, you may not have a normal bowel movement for a few days.  Please Note:  You might notice some irritation and congestion in your nose or some drainage.  This is from the oxygen used during your procedure.  There is no need for concern and it should clear up in a day or so.  SYMPTOMS TO REPORT IMMEDIATELY:  Following lower endoscopy (colonoscopy or flexible sigmoidoscopy):  Excessive amounts of blood in the stool  Significant tenderness or worsening of abdominal pains  Swelling of the abdomen that is new, acute  Fever of 100F or higher  For urgent or emergent issues, a gastroenterologist can be reached at any hour by calling [phone]. Do not use MyChart messaging for urgent concerns.    DIET:  We do recommend a small meal at first, but then you may proceed to your regular diet.  Drink  plenty of fluids but you should avoid alcoholic beverages for 24 hours.  ACTIVITY:  You should plan to take it easy for the rest of today and you should NOT DRIVE or use heavy machinery until tomorrow (because of the sedation medicines used during the test).    FOLLOW UP: Our staff will call the number listed on your records the next business day following your procedure.  We will call around 7:15- 8:00 am to check on you and address any questions or concerns that you may have regarding the information given to you following your procedure. If we do not reach you, we will leave a message.     If any biopsies were taken you will be contacted by phone or by letter within the next 1-3 weeks.  Please call us  at [phone] if you have not heard about the biopsies in 3 weeks.    SIGNATURES/CONFIDENTIALITY: You and/or your care partner have signed paperwork which will be entered into your electronic medical record.  These signatures attest to the fact that that the information above on your After Visit Summary has been reviewed and is understood.  Full responsibility of the confidentiality of this discharge information lies with you and/or your care-partner.

## 2023-09-30 NOTE — Telephone Encounter (Signed)
 Spoke with pt- she has been able to eat a little bit of food without nausea or vomiting; states pain is about the same but has passed a small amount of air.  Shared Dr. Hassan recommendations with her and understanding voiced

## 2023-09-30 NOTE — Telephone Encounter (Signed)
 Patient care giver name Garnette, called and stated that the patient before leaving our practice from her colonoscopy procedure was complaining of bloating and abdominal pressure and since she has been home her pain and discomfort has progressed and seems to not be getting better. Patient is having abdominal pain on her left side and around her rib cage. Patient care giver is requesting a call back at [phone]. Please advise.

## 2023-09-30 NOTE — Progress Notes (Signed)
 [facility] History and Physical   Primary Care Physician:  Pura Lenis, MD   Reason for Procedure:  History of adenomatous colon polyps  Plan:    Surveillance colonoscopy with possible interventions as needed     HPI: Anita Snow is a very pleasant 63 y.o. female here for surveillance colonoscopy. Denies any nausea, vomiting, abdominal pain, melena or bright red blood per rectum  The risks and benefits as well as alternatives of endoscopic procedure(s) have been discussed and reviewed. All questions answered. The patient agrees to proceed.    Past Medical History:  Diagnosis Date   ADHD (attention deficit hyperactivity disorder)    Allergy    Arthritis    Asthma    exercised inducded asthma    Eczema    GERD (gastroesophageal reflux disease)    Glaucoma    Hiatal hernia    Hyperlipidemia    Mass of finger    LEFT THUMB   Pain of left thumb    PONV (postoperative nausea and vomiting)    Wears glasses    White coat syndrome without hypertension     Past Surgical History:  Procedure Laterality Date   CESAREAN SECTION  1991 & 1994   COLONOSCOPY     D & C HYSTECSCOPY W/ RESECTION POLYP  04/2015   FINGER SURGERY Right 2024   KNEE ARTHROSCOPY Right 2010   MASS EXCISION Left 10/15/2015   Procedure: LEFT THUMB EXCISION MASS WITH ARTHROTOMY AND SYNOVECTOMY;  Surgeon: Elsie Mussel, MD;  Location: [facility];  Service: Orthopedics;  Laterality: Left;   OTHER SURGICAL HISTORY     02/2022 finger joint fusion   SHOULDER ARTHROSCOPY Left 2007   buritis   TONSILLECTOMY AND ADENOIDECTOMY  as child    Prior to Admission medications   Medication Sig Start Date End Date Taking? Authorizing Provider  aspirin 81 MG EC tablet aspirin 81 mg tablet,delayed release  TAKE 1 TABLET BY MOUTH TWICE WEEKLY 11/25/20  Yes [provider]  buPROPion  (WELLBUTRIN  XL) 300 MG 24 hr tablet Take 300 mg by mouth daily.   Yes [provider]   esomeprazole  (NEXIUM ) 20 MG capsule TAKE 1 CAPSULE DAILY 04/27/12  Yes Joylene Lamar SQUIBB, MD  fluticasone  (FLONASE ) 50 MCG/ACT nasal spray Place 2 sprays into the nose daily. 11/23/12  Yes Joylene Lamar SQUIBB, MD  IMVEXXY  MAINTENANCE PACK 10 MCG INST SMARTSIG:1 Insert Vaginal Twice a Week 10/07/22  Yes Chrzanowski, Jami B, NP  latanoprost (XALATAN) 0.005 % ophthalmic solution SMARTSIG:1 Drop(s) In Eye(s) Every Evening 07/19/19  Yes [provider]  magnesium  oxide (MAG-OX) 400 (240 Mg) MG tablet Take 200 mg by mouth. 08/18/23  Yes [provider]  nebivolol (BYSTOLIC) 2.5 MG tablet Take by mouth. 08/29/21  Yes [provider]  progesterone  (PROMETRIUM ) 100 MG capsule Take 2 capsules (200 mg total) by mouth daily. 10/07/22  Yes Chrzanowski, Jami B, NP  rosuvastatin (CRESTOR) 20 MG tablet Take 20 mg by mouth at bedtime. 05/31/23  Yes [provider]  albuterol  (PROVENTIL  HFA;VENTOLIN  HFA) 108 (90 Base) MCG/ACT inhaler Inhale 2 puffs into the lungs every 6 (six) hours as needed for wheezing. 11/17/16   Juliane Che, PA  AUVI-Q 0.3 MG/0.3ML SOAJ injection Inject into the muscle. Patient not taking: Reported on 09/30/2023 12/30/19   [provider]  meloxicam (MOBIC) 15 MG tablet Take 15 mg by mouth daily.    [provider]  NONFORMULARY OR COMPOUNDED ITEM Testosterone AVB (EMP) 0.2% (2 MG/ML)  Cream 30 Gram Tube S:  Apply 1/2 pea-sized amount to inner thigh at hs nightly. 06/09/23   Chrzanowski, Jami B, NP  valACYclovir (VALTREX) 500 MG tablet Take 500 mg by mouth daily. Patient not taking: No sig reported 07/14/19   [provider]    Current Outpatient Medications  Medication Sig Dispense Refill   aspirin 81 MG EC tablet aspirin 81 mg tablet,delayed release  TAKE 1 TABLET BY MOUTH TWICE WEEKLY     buPROPion  (WELLBUTRIN  XL) 300 MG 24 hr tablet Take 300 mg by mouth daily.     esomeprazole  (NEXIUM ) 20 MG capsule TAKE 1 CAPSULE DAILY 90  capsule 3   fluticasone  (FLONASE ) 50 MCG/ACT nasal spray Place 2 sprays into the nose daily. 16 g 5   IMVEXXY  MAINTENANCE PACK 10 MCG INST SMARTSIG:1 Insert Vaginal Twice a Week 24 each 4   latanoprost (XALATAN) 0.005 % ophthalmic solution SMARTSIG:1 Drop(s) In Eye(s) Every Evening     magnesium  oxide (MAG-OX) 400 (240 Mg) MG tablet Take 200 mg by mouth.     nebivolol (BYSTOLIC) 2.5 MG tablet Take by mouth.     progesterone  (PROMETRIUM ) 100 MG capsule Take 2 capsules (200 mg total) by mouth daily. 180 capsule 4   rosuvastatin (CRESTOR) 20 MG tablet Take 20 mg by mouth at bedtime.     albuterol  (PROVENTIL  HFA;VENTOLIN  HFA) 108 (90 Base) MCG/ACT inhaler Inhale 2 puffs into the lungs every 6 (six) hours as needed for wheezing. 1 Inhaler 0   AUVI-Q 0.3 MG/0.3ML SOAJ injection Inject into the muscle. (Patient not taking: Reported on 09/30/2023)     meloxicam (MOBIC) 15 MG tablet Take 15 mg by mouth daily.     NONFORMULARY OR COMPOUNDED ITEM Testosterone AVB (EMP) 0.2% (2 MG/ML) Cream 30 Gram Tube S:  Apply 1/2 pea-sized amount to inner thigh at hs nightly. 30 each 0   valACYclovir (VALTREX) 500 MG tablet Take 500 mg by mouth daily. (Patient not taking: No sig reported)     Current Facility-Administered Medications  Medication Dose Route Frequency Provider Last Rate Last Admin   0.9 %  sodium chloride  infusion  500 mL Intravenous Continuous Jervis Trapani V, MD        Allergies as of 09/30/2023 - Review Complete 09/30/2023  Allergen Reaction Noted   Amoxicillin-pot clavulanate Nausea And Vomiting 03/19/2020   Cephalosporins Swelling 10/09/2015   Shellfish allergy Swelling 11/23/2012    Family History  Problem Relation Age of Onset   Arthritis Mother    Alzheimer's disease Mother    Thyroid disease Mother    Hypertension Mother    Heart disease Father    Hypertension Father    Glaucoma Father    Heart disease Brother    Glaucoma Brother    Lung cancer Maternal Grandmother    Breast  cancer Maternal Grandmother    COPD Maternal Grandfather    Emphysema Maternal Grandfather    Heart disease Paternal Grandfather    Breast cancer Cousin        maternal cousin x 2   Colon cancer Neg Hx    Colon polyps Neg Hx    Esophageal cancer Neg Hx    Rectal cancer Neg Hx    Stomach cancer Neg Hx     Social History   Socioeconomic History   Marital status: Married    Spouse name: Marcey   Number of children: 2   Years of education: College   Highest education level: Not on file  Occupational History  Occupation: Engineer, structural: 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   Two adult sons.   1 son graduated from [employer]. Other son graduated from [employer] and moved home.    Her parents live in [place] . Mom with dementia.    Parent in laws are in assisted living.    Certified Yoga instructor.   Social Drivers of Corporate investment banker Strain: Low Risk  (09/20/2023)   Received from [facility]   Overall Financial Resource Strain (CARDIA)    How hard is it for you to pay for the very basics like food, housing, medical care, and heating?: Not hard at all  Food Insecurity: No Food Insecurity (09/20/2023)   Received from [facility]   Hunger Vital Sign    Within the past 12 months, you worried that your food would run out before you got the money to buy more.: Never true    Within the past 12 months, the food you bought just didn't last and you didn't have money to get more.: Never true   Transportation Needs: No Transportation Needs (09/20/2023)   Received from [facility] - Transportation    In the past 12 months, has lack of transportation kept you from medical appointments or from getting medications?: No    In the past 12 months, has lack of transportation kept you from meetings, work, or from getting things needed for daily living?: No  Physical Activity: Insufficiently Active (09/20/2023)   Received from [facility]   Exercise Vital Sign    On average, how many days per week do you engage in moderate to strenuous exercise (like a brisk walk)?: 3 days    On average, how many minutes do you engage in exercise at this level?: 20 min  Stress: No Stress Concern Present (09/20/2023)   Received from [facility] - Occupational Stress Questionnaire    Do you feel stress - tense, restless, nervous, or anxious, or unable to sleep at night because your mind is troubled all the time - these days?: Only a little  Social Connections: Socially Integrated (09/20/2023)   Received from [facility]   Social Network    How would you rate your social network (family, work, friends)?: Good participation with social networks  Intimate Partner Violence: Not At Risk (09/20/2023)   Received from [facility]   HITS    Over the last 12 months how often did your partner physically hurt you?: Never    Over the last 12 months how often did your partner insult you or talk down to you?: Never    Over the last 12 months how often did your partner threaten you with physical harm?: Never    Over the last 12 months how often did your partner scream or curse at you?: Never    Review of  Systems:  All other review of systems negative except as mentioned in the HPI.  Physical Exam: Vital signs in last 24 hours: BP 116/67   Pulse 63   Temp (!) 97.5 F (36.4 C)   Ht 5' 3.75 (1.619 m)   Wt 161 lb (73 kg)   LMP 11/13/2012   SpO2 100%   BMI  27.85 kg/m  General:   Alert, NAD Lungs:  Clear .   Heart:  Regular rate and rhythm Abdomen:  Soft, nontender and nondistended. Neuro/Psych:  Alert and cooperative. Normal mood and affect. A and O x 3  Reviewed labs, radiology imaging, old records and pertinent past GI work up  Patient is appropriate for planned procedure(s) and anesthesia in an ambulatory setting   K. Veena Arvella Massingale , MD [phone]

## 2023-09-30 NOTE — Progress Notes (Signed)
 Called to room to assist during endoscopic procedure.  Patient ID and intended procedure confirmed with present staff. Received instructions for my participation in the procedure from the performing physician.

## 2023-09-30 NOTE — Telephone Encounter (Signed)
 Procedure reviewed - no high risk interventions. Sounds like she probably has retained air post procedure causing this. I think the likelihood of a complication otherwise is low. Hopefully she can ambulate more, positional changes, sit on the toilet to pass air. Hopefully she will improve with time and pass more air. If she is not however, and feeling worse overnight - worsening pain, vomiting, fevers, etc, she would then need to go to the ED. Hopefully that is not needed.   Anita Snow can you check on her before you leave this evening to see how she is doing and relay recommendations? Thanks

## 2023-10-01 ENCOUNTER — Telehealth: Payer: Self-pay

## 2023-10-01 NOTE — Telephone Encounter (Signed)
 Called patient back to check, did not reach, will try again

## 2023-10-01 NOTE — Telephone Encounter (Signed)
 Follow up call to pt, lm for pt to call if having any difficulty with normal activities or eating and drinking.  Also to call if any other questions or concerns.

## 2023-10-04 LAB — SURGICAL PATHOLOGY

## 2023-10-04 MED ORDER — DICYCLOMINE HCL 10 MG PO CAPS: 10.0000 mg | ORAL_CAPSULE | Freq: Two times a day (BID) | ORAL | 0 refills | Status: DC | PRN

## 2023-10-04 NOTE — Addendum Note (Signed)
 Addended by: Celeste Candelas V on: 10/04/2023 12:05 PM   Modules accepted: Orders

## 2023-10-04 NOTE — Telephone Encounter (Signed)
 Returned pt's call. Reports she called and spoke with staff on Thursday after her procedure (see that phone note). Reports that on Thursday she was having pain on her left side and rib area. Reports that pain made it difficult for her to get in and out of recliner and while she was having that pain she was not able to take a deep breathe. Reports that pain has improved and she is able to take normal breathes. She took GasX over the weekend and was able to pass a small amount of gas. She has had a BM everyday except yesterday. Now reports lower abdominal pain at her umbilicus and lower. Reports the pain as pressure 3-4 out of 10. Reports her umbilicus is sore when touched and that the pain is worse when she is laying down or pressing on that area. Also reports continued bloating in upper abdomen. Reports she has had minimal appetite over the weekend. States she has eaten a small meal each day because she knew she needed to, but that she didn't really feel the want to eat. Pt is concerned because she is still having discomfort. States she feels a little better than she did, but that she still does not feel well enough to want to return to work.  MD please advise.

## 2023-10-04 NOTE — Telephone Encounter (Signed)
 Patient is calling back stating that she is still having abdominal pain from the belly button down and was wondering if this is something that will have to go away on its on. Patient is requesting a call back. Please advise.

## 2023-10-04 NOTE — Telephone Encounter (Signed)
 Called patient.  She had severe upper abdominal pain under the rib cage after the procedure, improving with Gas-X and with passing air, she is not experiencing pain in her upper abdomen anymore, currently it is lower periumbilical area, feels more like a strain.  Appetite is down but she is trying to eat, no exacerbation of pain after eating, she actually  feels the pain is somewhat better after she eats.  Denies any fever, chills, or blood in stool.  She is having small bowel movements. Overall she feels pain is improving, will continue to monitor.  Advised patient to call back with any changes in symptoms.  Trial dicyclomine  10 mg every 12 as needed. If she continues to have persistent abdominal pain or any worsening symptoms we will plan to obtain stat CT abdomen pelvis for further patient. Please check back tomorrow to see if her symptoms are continuing to improve? Thank you

## 2023-10-05 NOTE — Telephone Encounter (Signed)
 Called patient to get an update on symptoms. She reports she is feeling about the same as reported yesterday. Continues to report decrease in appetite, however she was able to keep down a waffle and yogurt. Denies fever, chills, nausea/vomiting. Still reports left sided abdominal tenderness with certain positioning. Patient states she was just able to get the Dicyclomine  this morning and took her first dose around 10 AM. Patient would like to try those medications first before we order the CT scan.

## 2023-10-05 NOTE — Telephone Encounter (Signed)
 Ok, please advise patient to call back if pain doesn't improve

## 2023-10-06 ENCOUNTER — Other Ambulatory Visit: Payer: Self-pay

## 2023-10-06 DIAGNOSIS — R109 Unspecified abdominal pain: Secondary | ICD-10-CM

## 2023-10-06 NOTE — Telephone Encounter (Signed)
 Attempted to call patient to see how she is feeling. No answer, left vm for patient to call back with any further concerns.

## 2023-10-06 NOTE — Telephone Encounter (Signed)
 Ok, thank you

## 2023-10-06 NOTE — Telephone Encounter (Signed)
 Patient reports that she is still feeling pain on her left side especially when she takes a deep breath. She reports tenderness around her umbilicus. She still has decreased appetite but is able to eat. Patient would like to go ahead and have the CT scan done. Will order the CT scan as a stat per previous notes.

## 2023-10-08 ENCOUNTER — Ambulatory Visit (HOSPITAL_BASED_OUTPATIENT_CLINIC_OR_DEPARTMENT_OTHER)
Admission: RE | Admit: 2023-10-08 | Discharge: 2023-10-08 | Disposition: A | Discharge status: Home / Self Care | Source: Ambulatory Visit | Attending: Gastroenterology | Admitting: Gastroenterology

## 2023-10-08 DIAGNOSIS — R109 Unspecified abdominal pain: Secondary | ICD-10-CM | POA: Insufficient documentation

## 2023-10-08 LAB — POCT I-STAT CREATININE: Creatinine, Ser: 1.1 mg/dL — ABNORMAL HIGH (ref 0.44–1.00)

## 2023-10-08 MED ORDER — IOHEXOL 300 MG/ML  SOLN
100.0000 mL | Freq: Once | INTRAMUSCULAR | Status: AC | PRN
  Administered 2023-10-08: 100 mL via INTRAVENOUS

## 2023-10-11 ENCOUNTER — Emergency Department (HOSPITAL_BASED_OUTPATIENT_CLINIC_OR_DEPARTMENT_OTHER)
Admission: EM | Admit: 2023-10-11 | Discharge: 2023-10-11 | Disposition: A | Discharge status: Home / Self Care | Source: Ambulatory Visit | Attending: Emergency Medicine | Admitting: Emergency Medicine

## 2023-10-11 ENCOUNTER — Emergency Department (HOSPITAL_BASED_OUTPATIENT_CLINIC_OR_DEPARTMENT_OTHER)

## 2023-10-11 ENCOUNTER — Encounter (HOSPITAL_BASED_OUTPATIENT_CLINIC_OR_DEPARTMENT_OTHER): Payer: Self-pay

## 2023-10-11 ENCOUNTER — Other Ambulatory Visit: Payer: Self-pay

## 2023-10-11 ENCOUNTER — Telehealth: Payer: Self-pay | Admitting: Gastroenterology

## 2023-10-11 DIAGNOSIS — J45909 Unspecified asthma, uncomplicated: Secondary | ICD-10-CM | POA: Insufficient documentation

## 2023-10-11 DIAGNOSIS — R1012 Left upper quadrant pain: Secondary | ICD-10-CM

## 2023-10-11 DIAGNOSIS — Z7951 Long term (current) use of inhaled steroids: Secondary | ICD-10-CM | POA: Diagnosis not present

## 2023-10-11 DIAGNOSIS — X58XXXA Exposure to other specified factors, initial encounter: Secondary | ICD-10-CM | POA: Diagnosis not present

## 2023-10-11 DIAGNOSIS — Z7982 Long term (current) use of aspirin: Secondary | ICD-10-CM | POA: Insufficient documentation

## 2023-10-11 DIAGNOSIS — S3991XA Unspecified injury of abdomen, initial encounter: Secondary | ICD-10-CM | POA: Diagnosis present

## 2023-10-11 DIAGNOSIS — S36039A Unspecified laceration of spleen, initial encounter: Secondary | ICD-10-CM | POA: Diagnosis not present

## 2023-10-11 LAB — COMPREHENSIVE METABOLIC PANEL WITH GFR
ALT: 23 U/L (ref 0–44)
AST: 21 U/L (ref 15–41)
Albumin: 4.7 g/dL (ref 3.5–5.0)
Alkaline Phosphatase: 68 U/L (ref 38–126)
Anion gap: 12 (ref 5–15)
BUN: 11 mg/dL (ref 8–23)
CO2: 22 mmol/L (ref 22–32)
Calcium: 10.1 mg/dL (ref 8.9–10.3)
Chloride: 104 mmol/L (ref 98–111)
Creatinine, Ser: 1.05 mg/dL — ABNORMAL HIGH (ref 0.44–1.00)
GFR, Estimated: 59 mL/min — ABNORMAL LOW (ref >60)
Glucose, Bld: 122 mg/dL — ABNORMAL HIGH (ref 70–99)
Potassium: 4.1 mmol/L (ref 3.5–5.1)
Sodium: 137 mmol/L (ref 135–145)
Total Bilirubin: 0.6 mg/dL (ref 0.0–1.2)
Total Protein: 7.6 g/dL (ref 6.5–8.1)

## 2023-10-11 LAB — CBC
HCT: 30.9 % — ABNORMAL LOW (ref 36.0–46.0)
Hemoglobin: 10.6 g/dL — ABNORMAL LOW (ref 12.0–15.0)
MCH: 31.5 pg (ref 26.0–34.0)
MCHC: 34.3 g/dL (ref 30.0–36.0)
MCV: 91.7 fL (ref 80.0–100.0)
Platelets: 344 K/uL (ref 150–400)
RBC: 3.37 MIL/uL — ABNORMAL LOW (ref 3.87–5.11)
RDW: 12.1 % (ref 11.5–15.5)
WBC: 6.3 K/uL (ref 4.0–10.5)
nRBC: 0 % (ref 0.0–0.2)

## 2023-10-11 LAB — LIPASE, BLOOD: Lipase: 31 U/L (ref 11–51)

## 2023-10-11 MED ORDER — OXYCODONE HCL 5 MG PO TABS: 5.0000 mg | ORAL_TABLET | Freq: Four times a day (QID) | ORAL | 0 refills | Status: AC | PRN

## 2023-10-11 MED ORDER — IOHEXOL 350 MG/ML SOLN
100.0000 mL | Freq: Once | INTRAVENOUS | Status: AC | PRN
Start: 2023-10-11 — End: 2023-10-11
  Administered 2023-10-11: 100 mL via INTRAVENOUS

## 2023-10-11 NOTE — Telephone Encounter (Signed)
 This patient needs to go to the hospital ASAP for evaluation, and at least observational admission, for splenic injury.  This explains her pain. I have included the inpatient team on this note. Thanks, Dr. Abran

## 2023-10-11 NOTE — Discharge Instructions (Addendum)
 It was a pleasure taking care of you today.  Based on your history, physical exam, labs, and imaging I feel you are safe for discharge.  Based off of your scan today it does appear that you have a splenic laceration and a splenic hematoma, however these appear stable and do not appear to be actively bleeding. Your hemoglobin was slightly reduced today at 10.6, this is most likely due to your recent bleeding from this injury.  I recommend that you follow-up with your primary care provider by the end of the week for a hemoglobin recheck and to follow-up with all of these findings.  Please also make your primary care provider aware of the lung nodules noted on the CT of your chest which need follow-up imaging in the future.  You have been prescribed a few tablets of oxycodone  to take for breakthrough pain, otherwise recommend taking over-the-counter Tylenol for pain and discomfort.  The max dose of over-the-counter Tylenol is 4000 mg/day, please stay within this daily limit.  I also recommend that you stay away from anti-inflammatory medications like Ibuprofen  until this injury heals as they can thin your blood.  If you experience any of the following symptoms including but not limited to severe abdominal pain, chest pain, shortness of breath, dizziness, weakness, distended abdomen, blood in stool, blood in urine, or other concerning symptoms recommend follow-up in the emergency department or seek further medical care. Please follow-up with your primary care by the end of the week for a hemoglobin re-check!

## 2023-10-11 NOTE — Telephone Encounter (Signed)
 Spoke with patient and she is aware of recommendations per Dr Abran and knows to proceed to the ER, she verbalized understanding.

## 2023-10-11 NOTE — Telephone Encounter (Signed)
 Patient currently at [facility] ED.

## 2023-10-11 NOTE — ED Provider Notes (Signed)
 [place] EMERGENCY DEPARTMENT AT [facility] Provider Note   CSN: [id number] Arrival date & time: 10/11/23  1447     Patient presents with: Abdominal Pain (LUQ)   Anita Snow is a 63 y.o. female who presents to the emergency department with a chief complaint of left upper quadrant and periumbilical abdominal pain.  Patient had a outpatient colonoscopy completed by GI on 09/23/2023.  After this procedure she began to have tenderness of her left upper quadrant as well as periumbilical area.  She also appreciates the left upper quadrant abdominal pain when taking a deep breath.  She has continued to follow-up with GI who ultimately ordered a CT scan of her abdomen pelvis on Friday, 10/08/2023, the CT scan is significant for possible splenic laceration/contusion with resultant intraperitoneal hematoma.  Patient was informed of CT scan results today by GI and instructed to come to the emergency department for evaluation.  Past medical history significant for GERD, osteoarthritis, hyperlipidemia, asthma, etc.  Denies blood thinners. Denies any other trauma/injury after or prior to colonoscopy.     Abdominal Pain     Prior to Admission medications   Medication Sig Start Date End Date Taking? Authorizing Provider  oxyCODONE  (ROXICODONE ) 5 MG immediate release tablet Take 1 tablet (5 mg total) by mouth every 6 (six) hours as needed for severe pain (pain score 7-10) or breakthrough pain. 10/11/23  Yes Tanijah Morais F, PA-C  albuterol  (PROVENTIL  HFA;VENTOLIN  HFA) 108 (90 Base) MCG/ACT inhaler Inhale 2 puffs into the lungs every 6 (six) hours as needed for wheezing. 11/17/16   Juliane Che, PA  aspirin 81 MG EC tablet aspirin 81 mg tablet,delayed release  TAKE 1 TABLET BY MOUTH TWICE WEEKLY 11/25/20   [provider]  AUVI-Q 0.3 MG/0.3ML SOAJ injection Inject into the muscle. Patient not taking: Reported on 09/30/2023 12/30/19   [provider]  buPROPion  (WELLBUTRIN  XL)  300 MG 24 hr tablet Take 300 mg by mouth daily.    [provider]  dicyclomine  (BENTYL ) 10 MG capsule Take 1 capsule (10 mg total) by mouth 3 times/day as needed-between meals & bedtime for spasms. 10/04/23   Nandigam, Kavitha V, MD  esomeprazole  (NEXIUM ) 20 MG capsule TAKE 1 CAPSULE DAILY 04/27/12   Joylene Lamar SQUIBB, MD  fluticasone  (FLONASE ) 50 MCG/ACT nasal spray Place 2 sprays into the nose daily. 11/23/12   Joylene Lamar SQUIBB, MD  IMVEXXY  MAINTENANCE PACK 10 MCG INST SMARTSIG:1 Insert Vaginal Twice a Week 10/07/22   Chrzanowski, Jami B, NP  latanoprost (XALATAN) 0.005 % ophthalmic solution SMARTSIG:1 Drop(s) In Eye(s) Every Evening 07/19/19   [provider]  magnesium  oxide (MAG-OX) 400 (240 Mg) MG tablet Take 200 mg by mouth. 08/18/23   [provider]  meloxicam (MOBIC) 15 MG tablet Take 15 mg by mouth daily.    [provider]  nebivolol (BYSTOLIC) 2.5 MG tablet Take by mouth. 08/29/21   [provider]  NONFORMULARY OR COMPOUNDED ITEM Testosterone AVB (EMP) 0.2% (2 MG/ML) Cream 30 Gram Tube S:  Apply 1/2 pea-sized amount to inner thigh at hs nightly. 06/09/23   Chrzanowski, Jami B, NP  progesterone  (PROMETRIUM ) 100 MG capsule Take 2 capsules (200 mg total) by mouth daily. 10/07/22   Chrzanowski, Jami B, NP  rosuvastatin (CRESTOR) 20 MG tablet Take 20 mg by mouth at bedtime. 05/31/23   [provider]  valACYclovir (VALTREX) 500 MG tablet Take 500 mg by mouth daily. Patient not taking: No sig reported 07/14/19  [provider]    Allergies: Amoxicillin-pot clavulanate, Cephalosporins, and Shellfish allergy    Review of Systems  Gastrointestinal:  Positive for abdominal pain.    Updated Vital Signs BP 132/89 (BP Location: Left Arm)   Pulse 65   Temp 98.2 F (36.8 C) (Oral)   Resp 18   LMP 11/13/2012   SpO2 100%   Physical Exam Vitals and nursing note reviewed.  Constitutional:      General: She is awake. She is not in  acute distress.    Appearance: She is well-developed. She is not ill-appearing, toxic-appearing or diaphoretic.  HENT:     Head: Normocephalic and atraumatic.  Eyes:     General: No scleral icterus. Pulmonary:     Effort: Pulmonary effort is normal. No respiratory distress.     Breath sounds: No wheezing, rhonchi or rales.  Abdominal:     General: Abdomen is flat.     Tenderness: There is abdominal tenderness in the periumbilical area and left upper quadrant. There is no right CVA tenderness, left CVA tenderness, guarding or rebound.  Skin:    General: Skin is warm.     Capillary Refill: Capillary refill takes less than 2 seconds.  Neurological:     General: No focal deficit present.     Mental Status: She is alert and oriented to person, place, and time.  Psychiatric:        Mood and Affect: Mood normal.        Behavior: Behavior normal. Behavior is cooperative.     (all labs ordered are listed, but only abnormal results are displayed) Labs Reviewed  COMPREHENSIVE METABOLIC PANEL WITH GFR - Abnormal; Notable for the following components:      Result Value   Glucose, Bld 122 (*)    Creatinine, Ser 1.05 (*)    GFR, Estimated 59 (*)    All other components within normal limits  CBC - Abnormal; Notable for the following components:   RBC 3.37 (*)    Hemoglobin 10.6 (*)    HCT 30.9 (*)    All other components within normal limits  LIPASE, BLOOD  URINALYSIS, ROUTINE W REFLEX MICROSCOPIC    EKG: None  Radiology: CT Angio Abd/Pel W and/or Wo Contrast Result Date: 10/11/2023 CLINICAL DATA:  Left-sided abdominal pain with abnormal spleen on previous CT, evaluate for splenic hematoma EXAM: CTA ABDOMEN AND PELVIS WITHOUT AND WITH CONTRAST TECHNIQUE: Multidetector CT imaging of the abdomen and pelvis was performed using the standard protocol during bolus administration of intravenous contrast. Multiplanar reconstructed images and MIPs were obtained and reviewed to evaluate the  vascular anatomy. RADIATION DOSE REDUCTION: This exam was performed according to the departmental dose-optimization program which includes automated exposure control, adjustment of the mA and/or kV according to patient size and/or use of iterative reconstruction technique. CONTRAST:  OMNIPAQUE  IOHEXOL  350 MG/ML SOLN COMPARISON:  10/08/2023 FINDINGS: VASCULAR Aorta: Atherosclerotic calcifications are noted. No aneurysmal dilatation or dissection is seen. Celiac: Patent without evidence of aneurysm, dissection, vasculitis or significant stenosis. SMA: Patent without evidence of aneurysm, dissection, vasculitis or significant stenosis. Renals: Both renal arteries are patent without evidence of aneurysm, dissection, vasculitis, fibromuscular dysplasia or significant stenosis. IMA: Patent without evidence of aneurysm, dissection, vasculitis or significant stenosis. Inflow: Iliac show atherosclerotic calcification without aneurysmal dilatation. Veins: No specific venous abnormality is noted. Review of the MIP images confirms the above findings. NON-VASCULAR Hepatobiliary: Liver demonstrates scattered hypodensities similar to that seen on the prior exam consistent with simple  cysts. No biliary ductal dilatation is noted. The gallbladder is within normal limits. Pancreas: Unremarkable. No pancreatic ductal dilatation or surrounding inflammatory changes. Spleen: Spleen again demonstrates some decreased attenuation inferiorly with a small hypodense collection measuring approximately 3.5 x 2.4 cm. This is stable in appearance from the prior exam. The area of decreased enhancement within the spleen is much less prominent when compared with the prior exam but again likely represents a mild splenic laceration with associated perisplenic hematoma. No active extravasation or pooling of contrast material is noted. Adrenals/Urinary Tract: Adrenal glands are within normal limits. Kidneys demonstrate a normal enhancement pattern.  No renal calculi or obstructive changes are seen. The bladder is decompressed. Stomach/Bowel: No obstructive or inflammatory changes of the colon are seen. Scattered diverticular changes noted without evidence of diverticulitis. The appendix is within normal limits. Small bowel and stomach are within normal limits. Lymphatic: Lymphadenopathy is identified. Reproductive: Uterus and bilateral adnexa are unremarkable. Other: Free pelvic fluid is again identified which is hyperdense consistent with prior hemorrhage. The overall appearance is stable indicating no significant interval hemorrhage. Musculoskeletal: No acute or significant osseous findings. Stable pars defects are noted at L5. IMPRESSION: VASCULAR Scattered atherosclerotic changes are noted. No acute arterial abnormality is seen. No evidence of active extravasation from the spleen. NON-VASCULAR Changes in the inferolateral aspect of the spleen consistent with known mild splenic laceration and adjacent splenic hematoma. The overall appearance is stable from the prior exam. No active extravasation from the spleen is noted. Persistent pelvic hemorrhage is noted stable from the prior exam of 3 days previous. No significant interval hemorrhage is noted. Chronic changes as described above. Electronically Signed   By: Oneil Devonshire M.D.   On: 10/11/2023 19:54   CT Angio Chest PE W and/or Wo Contrast Result Date: 10/11/2023 CLINICAL DATA:  Shortness of breath EXAM: CT ANGIOGRAPHY CHEST WITH CONTRAST TECHNIQUE: Multidetector CT imaging of the chest was performed using the standard protocol during bolus administration of intravenous contrast. Multiplanar CT image reconstructions and MIPs were obtained to evaluate the vascular anatomy. RADIATION DOSE REDUCTION: This exam was performed according to the departmental dose-optimization program which includes automated exposure control, adjustment of the mA and/or kV according to patient size and/or use of iterative  reconstruction technique. CONTRAST:  OMNIPAQUE  IOHEXOL  350 MG/ML SOLN COMPARISON:  None Available. FINDINGS: Cardiovascular: Thoracic aorta demonstrates atherosclerotic calcifications. No aneurysmal dilatation or dissection is noted. Mild coronary calcifications are seen. No cardiac enlargement is noted. The pulmonary artery shows a normal branching pattern bilaterally without evidence of intraluminal filling defect to suggest pulmonary embolism. Mediastinum/Nodes: Thoracic inlet is within normal limits. No hilar or mediastinal adenopathy is noted. The esophagus as visualized is within normal limits. Lungs/Pleura: Lungs are well aerated bilaterally. 6 mm part solid nodule is noted in the medial aspect of the left apex best seen on image number 29 of series 9. A smaller left upper lobe nodule is noted on image number 69 of series 9 measuring less than 3 mm. No effusion or focal infiltrate is seen. No other sizable nodules are seen. Musculoskeletal: Degenerative changes of the thoracic spine are noted. No acute rib abnormality is seen. No compression deformity is noted. Review of the MIP images confirms the above findings. IMPRESSION: No evidence of acute aortic abnormality or acute pulmonary embolism. Multiple pulmonary nodules. Most significant: Left part-solid pulmonary nodule within the upper lobe measuring 6 mm. Per Fleischner Society Guidelines, recommend a non-contrast Chest CT at 3-6 months to confirm persistence. If  unchanged and the solid component remains < 6 mm, an annual non-contrast Chest CT should be performed for 5 years. If the solid component is 6-8 mm on follow-up, recommend biopsy or resection. If the solid component is > 8 mm on follow-up, recommend PET/CT, biopsy or resection. These guidelines do not apply to immunocompromised patients and patients with cancer. Follow up in patients with significant comorbidities as clinically warranted. For lung cancer screening, adhere to Lung-RADS  guidelines. Reference: Radiology. 2017; 284(1):228-43. No other focal abnormality is noted. Aortic Atherosclerosis (ICD10-I70.0). Electronically Signed   By: Oneil Devonshire M.D.   On: 10/11/2023 19:47     Procedures   Medications Ordered in the ED  iohexol  (OMNIPAQUE ) 350 MG/ML injection 100 mL (100 mLs Intravenous Contrast Given 10/11/23 1855)                                    Medical Decision Making Amount and/or Complexity of Data Reviewed Labs: ordered. Radiology: ordered.  Risk Prescription drug management.   Patient presents to the ED for concern of left upper quadrant and periumbilical abdominal pain, this involves an extensive number of treatment options, and is a complaint that carries with it a high risk of complications and morbidity.  The differential diagnosis includes splenic hematoma, splenic laceration, splenic infarct, gastritis, diverticulitis, appendicitis, cholecystitis, cholangitis, trauma/injury, etc.   Co morbidities that complicate the patient evaluation   GERD, osteoarthritis, hyperlipidemia, asthma   Additional history obtained:  Reviewed outpatient CT scan from Friday which showed possible splenic injury  Lab Tests:  I Ordered, and personally interpreted labs.  The pertinent results include: Lab workup significant for hemoglobin of 10.6, creatinine of 1.05, urine was collected but never processed, checked on this with nursing and stated urine was taken to lab, lab called and unsure where urine is currently, at this time I do not believe UA will effect management    Imaging Studies ordered:  I ordered imaging studies including CT angio abdomen and pelvis, CT angio chest PE I independently visualized and interpreted imaging which showed: CT angio chest PE: No evidence of PE, multiple pulmonary nodules that need outpatient follow-up imaging, patient educated about these and told to inform her primary care provider CT angio abdomen pelvis: Findings  consistent for possible splenic laceration and splenic hematoma, pelvic hemorrhage, no active extravasation noted by radiologist, findings appear stable when compared to scan on Friday I agree with the radiologist interpretation   Medicines ordered and prescription drug management: I have reviewed the patients home medicines and have made adjustments as needed   Test Considered:  None   Critical Interventions:  None   Problem List / ED Course:  63 year old female, vital signs stable, recent colonoscopy on 09/23/2023, persistent abdominal pain and pain when taking a deep breath since, followed by GI with outpatient CT scan ordered last Friday, significant for possible splenic injury and intraperitoneal hematoma, told to go to the emergency department for evaluation On initial examination patient overall well-appearing, tenderness to palpation of periumbilical area as well as left upper quadrant, no rebound or guarding, belly soft, vital signs stable Hemoglobin today 10.6, on outpatient lab work approximately 2-1/2 weeks ago hemoglobin was noted to be 12.6, will order CT angio of chest, abdomen, pelvis to rule out PE due to the patient mentioning subjective shortness of breath, inability to take a deep breath due to pain, and also evaluate abdominal findings, patient  denies any symptoms of anemia, denies blood in stool or urine as well Findings on imaging show no evidence of PE however splenic laceration and splenic hematoma are noted with pelvic hemorrhage however findings appear stable when compared to imaging completed on Friday, no active bleeding, based off of timeline from incident I feel this is overall reassuring Instructed patient to follow-up this week with her primary care provider for a lab recheck to ensure that her hemoglobin remained stable, at this time I feel like this patient is stable for discharge as most likely this incident occurred 2-1/2 weeks ago at this point, doubt acute  life-threatening hemorrhage occurring based off of findings today, patient is not on a blood thinning medication, instructed her not to take any anti-inflammatory medications and take only Tylenol for pain, also given short course of narcotic pain medication for breakthrough pain Extensive return precautions given Patient discharged Most likely diagnosis at this time a splenic laceration and hematoma with pelvic hemorrhage however no sign of active bleeding today, patient vital signs remained stable throughout encounter, hemoglobin 10.6 however comparison is roughly 12.6, believe that this acute drop is due to the injury, unsure of mechanism of injury at this time as patient denies trauma/injury however patient states that symptoms started after colonoscopy, instructed to follow-up with primary care provider for lab recheck and ongoing diagnosis and treatment and make them aware of lung nodule imaging as well    Reevaluation:  After the interventions noted above, I reevaluated the patient and found that they have :stayed the same   Social Determinants of Health:  none   Dispostion:  After consideration of the diagnostic results and the patients response to treatment, I feel that the patent would benefit from discharge and outpatient therapy as described.  Follow-up with primary care provider this week for ongoing diagnosis and treatment and lab recheck.     Final diagnoses:  Left upper quadrant abdominal pain  Laceration of spleen, initial encounter    ED Discharge Orders          Ordered    oxyCODONE  (ROXICODONE ) 5 MG immediate release tablet  Every 6 hours PRN        10/11/23 2103               Emeterio Balke F, PA-C 10/11/23 2256    Jerrol Agent, MD 10/12/23 [phone]

## 2023-10-11 NOTE — ED Triage Notes (Signed)
 Pt reports colonoscopy 8/14, a lot of side pain, didn't get better through the week, CT scan Friday, was advised by Mariellen to come to ED r/t splenic injury. Pt advises pain is positional (cannot lay on L side, cannot take deep breath)

## 2023-10-11 NOTE — Telephone Encounter (Signed)
 PT is calling to have Dr. Shila discuss her CT results. Please advise.

## 2023-10-12 ENCOUNTER — Ambulatory Visit: Payer: Self-pay | Admitting: Gastroenterology

## 2023-10-12 ENCOUNTER — Other Ambulatory Visit: Payer: Self-pay

## 2023-10-12 DIAGNOSIS — R1012 Left upper quadrant pain: Secondary | ICD-10-CM

## 2023-10-12 DIAGNOSIS — S36029D Unspecified contusion of spleen, subsequent encounter: Secondary | ICD-10-CM

## 2023-10-12 NOTE — Telephone Encounter (Signed)
 Orders for imaging entered. Radiology scheduling notified.

## 2023-10-12 NOTE — Telephone Encounter (Signed)
 Called patient, she is feeling better, has persistent discomfort in the left upper quadrant.  She was seen in the ER, CT angio negative for any active extravasation, stable hematoma. Hemoglobin has down trended from baseline to 10, she has a follow-up appointment with her PCP this afternoon with follow-up CBC Will plan to repeat CBC on September 8 to make sure stable and plan for repeat CT abdomen pelvis on September 22 to document resolution. She is planning to travel to [place] around mid September.  Advised her to avoid any strenuous activity, lifting heavy weights, call with any changes in symptoms.

## 2023-10-15 ENCOUNTER — Encounter: Admitting: Radiology

## 2023-10-22 ENCOUNTER — Other Ambulatory Visit: Payer: Self-pay | Admitting: Radiology

## 2023-10-22 DIAGNOSIS — N958 Other specified menopausal and perimenopausal disorders: Secondary | ICD-10-CM

## 2023-10-22 NOTE — Telephone Encounter (Signed)
 Med refill request: [id number]   Last AEX: 10/07/22  Next AEX: 12/02/23 Last MMG (if hormonal med) 11/19/22 birads cat 1 neg  Refill authorized: last rx 10/07/22 #24 with 4 refills. Please approve or deny

## 2023-11-03 ENCOUNTER — Other Ambulatory Visit: Payer: Self-pay

## 2023-11-03 DIAGNOSIS — Z79890 Hormone replacement therapy (postmenopausal): Secondary | ICD-10-CM

## 2023-11-03 MED ORDER — NONFORMULARY OR COMPOUNDED ITEM: 0 refills | Status: AC

## 2023-11-03 NOTE — Telephone Encounter (Signed)
 Medication refill request: testosterone cream  Last AEX:  10/07/22 Next AEX: 12/02/23 Last MMG (if hormonal medication request): 11/23/22 Refill authorized: please advise

## 2023-11-04 ENCOUNTER — Other Ambulatory Visit

## 2023-11-04 DIAGNOSIS — S36029D Unspecified contusion of spleen, subsequent encounter: Secondary | ICD-10-CM

## 2023-11-04 DIAGNOSIS — R1012 Left upper quadrant pain: Secondary | ICD-10-CM

## 2023-11-04 LAB — CBC WITH DIFFERENTIAL/PLATELET
Basophils Absolute: 0 K/uL (ref 0.0–0.1)
Basophils Relative: 0.9 % (ref 0.0–3.0)
Eosinophils Absolute: 0.1 K/uL (ref 0.0–0.7)
Eosinophils Relative: 1.7 % (ref 0.0–5.0)
HCT: 36.9 % (ref 36.0–46.0)
Hemoglobin: 12.5 g/dL (ref 12.0–15.0)
Lymphocytes Relative: 25.8 % (ref 12.0–46.0)
Lymphs Abs: 1.4 K/uL (ref 0.7–4.0)
MCHC: 33.8 g/dL (ref 30.0–36.0)
MCV: 90.6 fl (ref 78.0–100.0)
Monocytes Absolute: 0.5 K/uL (ref 0.1–1.0)
Monocytes Relative: 8.5 % (ref 3.0–12.0)
Neutro Abs: 3.5 K/uL (ref 1.4–7.7)
Neutrophils Relative %: 63.1 % (ref 43.0–77.0)
Platelets: 243 K/uL (ref 150.0–400.0)
RBC: 4.07 Mil/uL (ref 3.87–5.11)
RDW: 12.7 % (ref 11.5–15.5)
WBC: 5.5 K/uL (ref 4.0–10.5)

## 2023-11-05 NOTE — Telephone Encounter (Signed)
 Called [facility] [phone] - Spoke to [facility] They will start working on this refill request right away.  Called Ms. Anita Snow [phone] Pt stated she is very grateful to NP for approving the refill.

## 2023-11-05 NOTE — Telephone Encounter (Signed)
 Pt called this morning and left a voice message stating that she received a call from [facility] saying the testosterone Rx was denied. Pt added that she is sorry she had to reschedule her appointment for October, but she will be here for sure. Pt is asking that the Rx be approved and sent to [facility] or can someone give her a call to let her know what else she has to do to get a refill?  Please advise.

## 2023-11-07 ENCOUNTER — Ambulatory Visit (HOSPITAL_BASED_OUTPATIENT_CLINIC_OR_DEPARTMENT_OTHER)
Admission: RE | Admit: 2023-11-07 | Discharge: 2023-11-07 | Disposition: A | Discharge status: Home / Self Care | Source: Ambulatory Visit | Attending: Gastroenterology | Admitting: Gastroenterology

## 2023-11-07 DIAGNOSIS — R1012 Left upper quadrant pain: Secondary | ICD-10-CM | POA: Insufficient documentation

## 2023-11-07 DIAGNOSIS — S36029D Unspecified contusion of spleen, subsequent encounter: Secondary | ICD-10-CM | POA: Diagnosis present

## 2023-11-07 MED ORDER — IOHEXOL 300 MG/ML  SOLN
80.0000 mL | Freq: Once | INTRAMUSCULAR | Status: AC | PRN
  Administered 2023-11-07: 80 mL via INTRAVENOUS

## 2023-11-08 ENCOUNTER — Ambulatory Visit: Payer: Self-pay | Admitting: Gastroenterology

## 2023-11-18 ENCOUNTER — Other Ambulatory Visit: Payer: Self-pay | Admitting: Radiology

## 2023-11-18 DIAGNOSIS — Z79890 Hormone replacement therapy (postmenopausal): Secondary | ICD-10-CM

## 2023-11-19 NOTE — Telephone Encounter (Signed)
 Med refill request: progesterone  100 mg Last AEX: 10/07/22 Next AEX: 12/02/23 Last MMG (if hormonal med) 11/19/22 BI-RADS 1 negative Refill authorized: Please Advise?

## 2023-12-01 ENCOUNTER — Encounter: Admitting: Radiology

## 2023-12-02 ENCOUNTER — Encounter: Payer: Self-pay | Admitting: Radiology

## 2023-12-02 ENCOUNTER — Ambulatory Visit (INDEPENDENT_AMBULATORY_CARE_PROVIDER_SITE_OTHER): Admitting: Radiology

## 2023-12-02 VITALS — BP 120/76 | HR 62 | Temp 98.1°F | Ht 63.75 in | Wt 159.0 lb

## 2023-12-02 DIAGNOSIS — Z9189 Other specified personal risk factors, not elsewhere classified: Secondary | ICD-10-CM | POA: Diagnosis not present

## 2023-12-02 DIAGNOSIS — N958 Other specified menopausal and perimenopausal disorders: Secondary | ICD-10-CM | POA: Diagnosis not present

## 2023-12-02 DIAGNOSIS — R350 Frequency of micturition: Secondary | ICD-10-CM

## 2023-12-02 DIAGNOSIS — Z79890 Hormone replacement therapy (postmenopausal): Secondary | ICD-10-CM

## 2023-12-02 DIAGNOSIS — N858 Other specified noninflammatory disorders of uterus: Secondary | ICD-10-CM | POA: Diagnosis not present

## 2023-12-02 DIAGNOSIS — Z01419 Encounter for gynecological examination (general) (routine) without abnormal findings: Secondary | ICD-10-CM

## 2023-12-02 MED ORDER — IMVEXXY MAINTENANCE PACK 10 MCG VA INST: VAGINAL_INSERT | VAGINAL | 4 refills | Status: AC

## 2023-12-02 MED ORDER — PROGESTERONE MICRONIZED 100 MG PO CAPS: 200.0000 mg | ORAL_CAPSULE | Freq: Every evening | ORAL | 4 refills | Status: AC

## 2023-12-02 MED ORDER — TESTOSTERONE 20.25 MG/ACT (1.62%) TD GEL
1.0000 | Freq: Every day | TRANSDERMAL | 1 refills | Status: AC
Start: 2023-12-02 — End: ?

## 2023-12-02 NOTE — Patient Instructions (Signed)
 Preventive Care 65-65 Years Old, Female  Preventive care refers to lifestyle choices and visits with your health care provider that can promote health and wellness. Preventive care visits are also called wellness exams.  What can I expect for my preventive care visit?  Counseling  Your health care provider may ask you questions about your:  Medical history, including:  Past medical problems.  Family medical history.  Pregnancy history.  Current health, including:  Menstrual cycle.  Method of birth control.  Emotional well-being.  Home life and relationship well-being.  Sexual activity and sexual health.  Lifestyle, including:  Alcohol, nicotine or tobacco, and drug use.  Access to firearms.  Diet, exercise, and sleep habits.  Work and work Astronomer.  Sunscreen use.  Safety issues such as seatbelt and bike helmet use.  Physical exam  Your health care provider will check your:  Height and weight. These may be used to calculate your BMI (body mass index). BMI is a measurement that tells if you are at a healthy weight.  Waist circumference. This measures the distance around your waistline. This measurement also tells if you are at a healthy weight and may help predict your risk of certain diseases, such as type 2 diabetes and high blood pressure.  Heart rate and blood pressure.  Body temperature.  Skin for abnormal spots.  What immunizations do I need?    Vaccines are usually given at various ages, according to a schedule. Your health care provider will recommend vaccines for you based on your age, medical history, and lifestyle or other factors, such as travel or where you work.  What tests do I need?  Screening  Your health care provider may recommend screening tests for certain conditions. This may include:  Lipid and cholesterol levels.  Diabetes screening. This is done by checking your blood sugar (glucose) after you have not eaten for a while (fasting).  Pelvic exam and Pap test.  Hepatitis B test.  Hepatitis C  test.  HIV (human immunodeficiency virus) test.  STI (sexually transmitted infection) testing, if you are at risk.  Lung cancer screening.  Colorectal cancer screening.  Mammogram. Talk with your health care provider about when you should start having regular mammograms. This may depend on whether you have a family history of breast cancer.  BRCA-related cancer screening. This may be done if you have a family history of breast, ovarian, tubal, or peritoneal cancers.  Bone density scan. This is done to screen for osteoporosis.  Talk with your health care provider about your test results, treatment options, and if necessary, the need for more tests.  Follow these instructions at home:  Eating and drinking    Eat a diet that includes fresh fruits and vegetables, whole grains, lean protein, and low-fat dairy products.  Take vitamin and mineral supplements as recommended by your health care provider.  Do not drink alcohol if:  Your health care provider tells you not to drink.  You are pregnant, may be pregnant, or are planning to become pregnant.  If you drink alcohol:  Limit how much you have to 0-1 drink a day.  Know how much alcohol is in your drink. In the [place] one drink equals one 12 oz bottle of beer (355 mL), one 5 oz glass of wine (148 mL), or one 1 oz glass of hard liquor (44 mL).  Lifestyle  Brush your teeth every morning and night with fluoride toothpaste. Floss one time each day.  Exercise for at least  30 minutes 5 or more days each week.  Do not use any products that contain nicotine or tobacco. These products include cigarettes, chewing tobacco, and vaping devices, such as e-cigarettes. If you need help quitting, ask your health care provider.  Do not use drugs.  If you are sexually active, practice safe sex. Use a condom or other form of protection to prevent STIs.  If you do not wish to become pregnant, use a form of birth control. If you plan to become pregnant, see your health care provider for a  prepregnancy visit.  Take aspirin only as told by your health care provider. Make sure that you understand how much to take and what form to take. Work with your health care provider to find out whether it is safe and beneficial for you to take aspirin daily.  Find healthy ways to manage stress, such as:  Meditation, yoga, or listening to music.  Journaling.  Talking to a trusted person.  Spending time with friends and family.  Minimize exposure to UV radiation to reduce your risk of skin cancer.  Safety  Always wear your seat belt while driving or riding in a vehicle.  Do not drive:  If you have been drinking alcohol. Do not ride with someone who has been drinking.  When you are tired or distracted.  While texting.  If you have been using any mind-altering substances or drugs.  Wear a helmet and other protective equipment during sports activities.  If you have firearms in your house, make sure you follow all gun safety procedures.  Seek help if you have been physically or sexually abused.  What's next?  Visit your health care provider once a year for an annual wellness visit.  Ask your health care provider how often you should have your eyes and teeth checked.  Stay up to date on all vaccines.  This information is not intended to replace advice given to you by your health care provider. Make sure you discuss any questions you have with your health care provider.  Document Revised: 07/31/2020 Document Reviewed: 07/31/2020  Elsevier Patient Education  2024 [employer].

## 2023-12-02 NOTE — Progress Notes (Signed)
 Anita Snow [facility] [birth year] [id number]   History: Postmenopausal 63 y.o. presents for high risk breast and pelvic exam. C/O of urinary frequency, urgency, no dysuria. Symptoms started yesterday. Splenic injury during colonoscopy. CT showed something in the uterus, needs f/u u/s.  >/= 5 sexual partners in a lifetime: Yes  First intercourse <61 years of age: No  H/O STD at any age: No  Abnormal pap smear, < 3 negative paps within the last 7 years: No  DES exposure (women born between [phone]): No  Patient is on post breast cancer medication like Femara or, if medication like this is not needed, 5 years post breast cancer diagnosis: No    Gynecologic History Postmenopausal Last Pap: 10/15/21 normal, HPV negative  Last mammogram: 11/19/22 Last colonoscopy: 09/30/23 polyps, Spleen injury DEXA:6/24 osteopenia HRT use: current, progesterone  and testosterone  Obstetric History OB History  Gravida Para Term Preterm AB Living  3 2   1 2   SAB IAB Ectopic Multiple Live Births  1    2    # Outcome Date GA Lbr Len/2nd Weight Sex Type Anes PTL Lv  3 SAB           2 Para           1 Para                06/01/2017    5:05 PM 05/28/2017    3:49 PM 05/25/2017    3:07 PM  Depression screen PHQ 2/9  Decreased Interest 0 0 0  Down, Depressed, Hopeless 0 0 0  PHQ - 2 Score 0 0 0     The following portions of the patient's history were reviewed and updated as appropriate: allergies, current medications, past family history, past medical history, past social history, past surgical history, and problem list.  Review of Systems Pertinent items noted in HPI and remainder of comprehensive ROS otherwise negative.  Past medical history, past surgical history, family history and social history were all reviewed and documented in the EPIC chart.  Exam:  Vitals:   12/02/23 0801  BP: 120/76  Pulse: 62  Temp: 98.1 F (36.7 C)  TempSrc: Oral  SpO2: 99%  Weight: 159 lb (72.1 kg)  Height: 5' 3.75  (1.619 m)   Body mass index is 27.51 kg/m.  General appearance:  Normal Thyroid:  Symmetrical, normal in size, without palpable masses or nodularity. Respiratory  Auscultation:  Clear without wheezing or rhonchi Cardiovascular  Auscultation:  Regular rate, without rubs, murmurs or gallops  Edema/varicosities:  Not grossly evident Abdominal  Soft,nontender, without masses, guarding or rebound.  Liver/spleen:  No organomegaly noted  Hernia:  None appreciated  Skin  Inspection:  Grossly normal Breasts: Examined lying and sitting.   Right: Without masses, retractions, nipple discharge or axillary adenopathy.   Left: Without masses, retractions, nipple discharge or axillary adenopathy. Genitourinary   Inguinal/mons:  Normal without inguinal adenopathy  External genitalia:  Normal appearing vulva with no masses, tenderness, or lesions  BUS/Urethra/Skene's glands:  Normal  Vagina:  Normal appearing with normal color and discharge, no lesions. Atrophy: mild   Cervix:  Normal appearing without discharge or lesions  Uterus:  Normal in size, shape and contour.  Midline and mobile, nontender  Adnexa/parametria:     Rt: Normal in size, without masses or tenderness.   Lt: Normal in size, without masses or tenderness.  Anus and perineum: Normal    Darice Hoit, CMA present for exam  Assessment/Plan:   1. Encounter  for breast and pelvic examination (Primary) Pap 2026  2. Urinary frequency Negative UA - Urinalysis,Complete w/RFL Culture  3. Uterine mass - US  PELVIS TRANSVAGINAL NON-OB (TV ONLY); Future  4. Hormone replacement therapy (HRT) Will switch to androgel for cost savings - progesterone  (PROMETRIUM ) 100 MG capsule; Take 2 capsules (200 mg total) by mouth at bedtime.  Dispense: 180 capsule; Refill: 4 - Testosterone (ANDROGEL PUMP) 20.25 MG/ACT (1.62%) GEL; Place 1 Pump onto the skin daily.  Dispense: 75 g; Refill: 1  5. Genitourinary syndrome of menopause - Estradiol   (IMVEXXY  MAINTENANCE PACK) 10 MCG INST; INSERT ONE (1) SUPPOSITTORY VAGINALLY TWICE A WEEK  Dispense: 24 each; Refill: 4    Return in 1 year for annual or sooner prn.  Christina Waldrop B WHNP-BC, 8:53 AM 12/02/2023

## 2023-12-04 LAB — URINALYSIS, COMPLETE W/RFL CULTURE
Bilirubin Urine: NEGATIVE
Glucose, UA: NEGATIVE
Hyaline Cast: NONE SEEN /LPF
Ketones, ur: NEGATIVE
Leukocyte Esterase: NEGATIVE
Nitrites, Initial: NEGATIVE
Protein, ur: NEGATIVE
Specific Gravity, Urine: 1.02 (ref 1.001–1.035)
pH: 5.5 (ref 5.0–8.0)

## 2023-12-04 LAB — URINE CULTURE
MICRO NUMBER:: 17108415
Result:: NO GROWTH
SPECIMEN QUALITY:: ADEQUATE

## 2023-12-04 LAB — CULTURE INDICATED

## 2023-12-15 ENCOUNTER — Ambulatory Visit: Payer: Self-pay | Admitting: Gastroenterology

## 2023-12-16 ENCOUNTER — Telehealth: Payer: Self-pay | Admitting: Gastroenterology

## 2023-12-16 NOTE — Telephone Encounter (Signed)
 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 Patient is requesting a call back to discuss. Please advise.

## 2023-12-16 NOTE — Telephone Encounter (Signed)
 Called the patient. No answer. Left message of my call.

## 2023-12-16 NOTE — Telephone Encounter (Signed)
 Called patient back.  She has discomfort on the left side but the whole back is also sore.  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  Advised her to break up the classes.  Okay to use muscle relaxer as needed.  Call with any worsening symptoms.  If continues to have persistent pain, will consider referral to sports medicine for management.

## 2023-12-16 NOTE — Telephone Encounter (Signed)
 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 She is sore under her left rib. When walking, it only feels like sore muscles. However she feels more discomfort bending and sitting like in the car under that left rib. She is uncomfortable enough that she is taking Tylenol.

## 2023-12-27 LAB — HM MAMMOGRAPHY

## 2023-12-28 ENCOUNTER — Ambulatory Visit

## 2023-12-28 DIAGNOSIS — N858 Other specified noninflammatory disorders of uterus: Secondary | ICD-10-CM | POA: Diagnosis not present

## 2023-12-29 ENCOUNTER — Ambulatory Visit: Payer: Self-pay | Admitting: Radiology

## 2023-12-29 ENCOUNTER — Encounter: Payer: Self-pay | Admitting: Radiology

## 2024-01-07 ENCOUNTER — Encounter: Payer: Self-pay | Admitting: Radiology
